# Patient Record
Sex: Male | Born: 1990 | Race: White | Hispanic: No | Marital: Single | Smoking: Current every day smoker
Health system: Southern US, Community
[De-identification: ages and names within clinical notes are randomized; demographics above are authoritative.]

## PROBLEM LIST (undated history)

## (undated) DIAGNOSIS — F99 Mental disorder, not otherwise specified: Secondary | ICD-10-CM

## (undated) DIAGNOSIS — F32A Depression, unspecified: Secondary | ICD-10-CM

## (undated) DIAGNOSIS — F1910 Other psychoactive substance abuse, uncomplicated: Secondary | ICD-10-CM

## (undated) DIAGNOSIS — F329 Major depressive disorder, single episode, unspecified: Secondary | ICD-10-CM

## (undated) HISTORY — DX: Other psychoactive substance abuse, uncomplicated: F19.10

---

## 2013-05-13 ENCOUNTER — Ambulatory Visit (INDEPENDENT_AMBULATORY_CARE_PROVIDER_SITE_OTHER): Payer: BC Managed Care – PPO | Admitting: Emergency Medicine

## 2013-05-13 VITALS — BP 120/66 | HR 97 | Temp 98.6°F | Resp 18 | Ht 69.5 in | Wt 181.0 lb

## 2013-05-13 DIAGNOSIS — J018 Other acute sinusitis: Secondary | ICD-10-CM

## 2013-05-13 MED ORDER — PSEUDOEPHEDRINE-GUAIFENESIN ER 60-600 MG PO TB12: 1.0000 | ORAL_TABLET | Freq: Two times a day (BID) | ORAL | Status: DC

## 2013-05-13 MED ORDER — LEVOFLOXACIN 500 MG PO TABS: 500.0000 mg | ORAL_TABLET | Freq: Every day | ORAL | Status: AC

## 2013-05-13 NOTE — Progress Notes (Signed)
[facility] [address], [place] [address] [phone]  Date:  05/13/2013   Name:  Russell Hall   DOB:  March 12, 1991   MRN:  [id number]  PCP:  No PCP Per Patient    Chief Complaint: Cough and Sore Throat   History of Present Illness:  Russell Hall is a 22 y.o. very pleasant male patient who presents with the following:  Ill since Monday with nasal congestion and drainage green in color.  Has a cough productive of green sputum.  Low grade fever.  No wheezing or shortness of breath.  No rash, nausea or vomiting.  No improvement with over the counter medications or other home remedies. Denies other complaint or health concern today.   There are no active problems to display for this patient.   Past Medical History  Diagnosis Date  . Substance abuse     History reviewed. No pertinent past surgical history.  History  Substance Use Topics  . Smoking status: Never Smoker   . Smokeless tobacco: Not on file  . Alcohol Use: No    History reviewed. No pertinent family history.  Allergies  Allergen Reactions  . Penicillins     Medication list has been reviewed and updated.  No current outpatient prescriptions on file prior to visit.   No current facility-administered medications on file prior to visit.    Review of Systems:  As per HPI, otherwise negative.    Physical Examination: Filed Vitals:   05/13/13 1222  BP: 120/66  Pulse: 97  Temp: 98.6 F (37 C)  Resp: 18   Filed Vitals:   05/13/13 1222  Height: 5' 9.5" (1.765 m)  Weight: 181 lb (82.101 kg)   Body mass index is 26.35 kg/(m^2). Ideal Body Weight: Weight in (lb) to have BMI = 25: 171.4  GEN: WDWN, NAD, Non-toxic, A & O x 3 HEENT: Atraumatic, Normocephalic. Neck supple. No masses, No LAD. Ears and Nose: No external deformity. CV: RRR, No M/G/R. No JVD. No thrill. No extra heart sounds. PULM: CTA B, no wheezes, crackles, rhonchi. No retractions. No resp. distress. No  accessory muscle use. ABD: S, NT, ND, +BS. No rebound. No HSM. EXTR: No c/c/e NEURO Normal gait.  PSYCH: Normally interactive. Conversant. Not depressed or anxious appearing.  Calm demeanor.    Assessment and Plan: Sinusitis augmentin mucinex d  Signed,  Phillips Odor, MD

## 2013-05-13 NOTE — Patient Instructions (Addendum)

## 2013-07-25 ENCOUNTER — Emergency Department (HOSPITAL_COMMUNITY)
Admission: EM | Admit: 2013-07-25 | Discharge: 2013-07-25 | Disposition: A | Discharge status: Home / Self Care | Payer: BC Managed Care – PPO | Attending: Emergency Medicine | Admitting: Emergency Medicine

## 2013-07-25 ENCOUNTER — Emergency Department (HOSPITAL_COMMUNITY): Payer: BC Managed Care – PPO

## 2013-07-25 ENCOUNTER — Encounter (HOSPITAL_COMMUNITY): Payer: Self-pay | Admitting: Emergency Medicine

## 2013-07-25 DIAGNOSIS — Y9239 Other specified sports and athletic area as the place of occurrence of the external cause: Secondary | ICD-10-CM | POA: Insufficient documentation

## 2013-07-25 DIAGNOSIS — S99912A Unspecified injury of left ankle, initial encounter: Secondary | ICD-10-CM

## 2013-07-25 DIAGNOSIS — R110 Nausea: Secondary | ICD-10-CM | POA: Insufficient documentation

## 2013-07-25 DIAGNOSIS — Z880 Allergy status to penicillin: Secondary | ICD-10-CM | POA: Insufficient documentation

## 2013-07-25 DIAGNOSIS — R296 Repeated falls: Secondary | ICD-10-CM | POA: Insufficient documentation

## 2013-07-25 DIAGNOSIS — S8990XA Unspecified injury of unspecified lower leg, initial encounter: Secondary | ICD-10-CM | POA: Insufficient documentation

## 2013-07-25 DIAGNOSIS — Y9361 Activity, american tackle football: Secondary | ICD-10-CM | POA: Insufficient documentation

## 2013-07-25 DIAGNOSIS — R209 Unspecified disturbances of skin sensation: Secondary | ICD-10-CM | POA: Insufficient documentation

## 2013-07-25 MED ORDER — NAPROXEN 500 MG PO TABS: 500.0000 mg | ORAL_TABLET | Freq: Two times a day (BID) | ORAL | Status: DC

## 2013-07-25 MED ORDER — IBUPROFEN 800 MG PO TABS: 800.0000 mg | ORAL_TABLET | Freq: Once | ORAL | Status: DC

## 2013-07-25 NOTE — ED Notes (Signed)
Patient states that he was playing basketball and turned his left ankle. Swelling noted

## 2013-07-25 NOTE — ED Provider Notes (Signed)
CSN: [id number]     Arrival date & time 07/25/13  1530 History  This chart was scribed for non-physician practitioner Arthor Captain, PA-C, working with Gilda Crease, MD, by Yevette Edwards, ED Scribe. This patient was seen in room WWFT/WWFT and the patient's care was started at 4:29 PM.  First MD Initiated Contact with Patient 07/25/13 1541     Chief Complaint  Patient presents with  . Ankle Pain   The history is provided by the patient. No language interpreter was used.   HPI Comments: Russell Hall is a 22 y.o. male who presents to the Emergency Department complaining of pain to his left ankle which occurred when he fell upon his ankle while playing football. The pt heard a "crack" when he fell upon his foot and he experienced immediate pain to his ankle which he rates as 8/10. The pt has experienced swelling to his left ankle as well. He has also experienced numbness and tingling to his left toes. He does not have an orthopedist. The pt is a non-smoker.   Past Medical History  Diagnosis Date  . Substance abuse    History reviewed. No pertinent past surgical history. No family history on file. History  Substance Use Topics  . Smoking status: Never Smoker   . Smokeless tobacco: Not on file  . Alcohol Use: No    Review of Systems  Constitutional: Negative for fever and chills.  Gastrointestinal: Positive for nausea (Secondary to pain).  Musculoskeletal: Positive for myalgias.  Neurological: Positive for weakness and numbness.  All other systems reviewed and are negative.    Allergies  Penicillins  Home Medications  No current outpatient prescriptions on file.  Triage Vitals: BP 157/84  Pulse 100  Temp(Src) 97.4 F (36.3 C) (Oral)  Resp 16  SpO2 96%  Physical Exam  Nursing note and vitals reviewed. Constitutional: He is oriented to person, place, and time. He appears well-developed and well-nourished. No distress.  HENT:  Head: Normocephalic and atraumatic.   Eyes: EOM are normal.  Neck: Neck supple. No tracheal deviation present.  Cardiovascular: Normal rate.   Pulmonary/Chest: Effort normal. No respiratory distress.  Musculoskeletal: Normal range of motion. He exhibits tenderness.  Significant swelling to ankle. Distal pulses are intact.   Neurological: He is alert and oriented to person, place, and time.  Skin: Skin is warm and dry.  Psychiatric: He has a normal mood and affect. His behavior is normal.    ED Course  Procedures (including critical care time)  DIAGNOSTIC STUDIES: Oxygen Saturation is 96% on room air, normal by my interpretation.    COORDINATION OF CARE:  4:31 PM- Discussed treatment plan with patient, and the patient agreed to the plan.   Labs Review Labs Reviewed - No data to display  maging Review Dg Ankle Complete Left  07/25/2013   CLINICAL DATA:  Pain and swelling laterally after fall and twisting.  EXAM: LEFT ANKLE COMPLETE - 3+ VIEW  COMPARISON:  None.  FINDINGS: Negative for fracture. Ankle mortise intact. No osseous degenerative change. Normal alignment. Lateral soft tissue swelling.  IMPRESSION: Lateral soft tissue swelling without fracture suggesting ligamentous injury.   Electronically Signed   By: Oley Balm M.D.   On: 07/25/2013 16:24    EKG Interpretation   None       MDM   1. Ankle injury, left, initial encounter    Patient X-Ray negative for obvious fracture or dislocation. Pain managed in ED. Pt advised to follow up with orthopedics  if symptoms persist for possibility of missed fracture diagnosis. Patient given brace while in ED, conservative therapy recommended and discussed. Patient will be dc home & is agreeable with above plan. I personally performed the services described in this documentation, which was scribed in my presence. The recorded information has been reviewed and is accurate. ]    Arthor Captain, PA-C 07/26/13 [phone]

## 2013-07-27 NOTE — ED Provider Notes (Signed)
Medical screening examination/treatment/procedure(s) were performed by non-physician practitioner and as supervising physician I was immediately available for consultation/collaboration.  EKG Interpretation   None         Gilda Crease, MD 07/27/13 [phone]

## 2013-09-30 ENCOUNTER — Inpatient Hospital Stay (HOSPITAL_COMMUNITY)
Admission: AD | Admit: 2013-09-30 | Discharge: 2013-10-04 | DRG: 897 | Disposition: A | Discharge status: Home / Self Care | Payer: BC Managed Care – PPO | Source: Intra-hospital | Attending: Psychiatry | Admitting: Psychiatry

## 2013-09-30 ENCOUNTER — Encounter (HOSPITAL_COMMUNITY): Payer: Self-pay | Admitting: Emergency Medicine

## 2013-09-30 ENCOUNTER — Emergency Department (HOSPITAL_COMMUNITY)
Admission: EM | Admit: 2013-09-30 | Discharge: 2013-09-30 | Disposition: A | Discharge status: Other Acute Inpatient Hospital | Payer: BC Managed Care – PPO | Attending: Emergency Medicine | Admitting: Emergency Medicine

## 2013-09-30 ENCOUNTER — Encounter (HOSPITAL_COMMUNITY): Payer: Self-pay

## 2013-09-30 DIAGNOSIS — F1920 Other psychoactive substance dependence, uncomplicated: Principal | ICD-10-CM | POA: Diagnosis present

## 2013-09-30 DIAGNOSIS — F1120 Opioid dependence, uncomplicated: Secondary | ICD-10-CM

## 2013-09-30 DIAGNOSIS — F1320 Sedative, hypnotic or anxiolytic dependence, uncomplicated: Secondary | ICD-10-CM

## 2013-09-30 DIAGNOSIS — F1994 Other psychoactive substance use, unspecified with psychoactive substance-induced mood disorder: Secondary | ICD-10-CM | POA: Diagnosis present

## 2013-09-30 DIAGNOSIS — F411 Generalized anxiety disorder: Secondary | ICD-10-CM | POA: Diagnosis present

## 2013-09-30 DIAGNOSIS — F17200 Nicotine dependence, unspecified, uncomplicated: Secondary | ICD-10-CM | POA: Diagnosis present

## 2013-09-30 DIAGNOSIS — R45851 Suicidal ideations: Secondary | ICD-10-CM

## 2013-09-30 DIAGNOSIS — F1110 Opioid abuse, uncomplicated: Secondary | ICD-10-CM | POA: Insufficient documentation

## 2013-09-30 DIAGNOSIS — F1910 Other psychoactive substance abuse, uncomplicated: Secondary | ICD-10-CM

## 2013-09-30 DIAGNOSIS — Z23 Encounter for immunization: Secondary | ICD-10-CM

## 2013-09-30 DIAGNOSIS — G4700 Insomnia, unspecified: Secondary | ICD-10-CM | POA: Diagnosis present

## 2013-09-30 DIAGNOSIS — F1410 Cocaine abuse, uncomplicated: Secondary | ICD-10-CM | POA: Diagnosis present

## 2013-09-30 DIAGNOSIS — F1310 Sedative, hypnotic or anxiolytic abuse, uncomplicated: Secondary | ICD-10-CM | POA: Insufficient documentation

## 2013-09-30 DIAGNOSIS — Z880 Allergy status to penicillin: Secondary | ICD-10-CM | POA: Insufficient documentation

## 2013-09-30 DIAGNOSIS — F329 Major depressive disorder, single episode, unspecified: Secondary | ICD-10-CM

## 2013-09-30 DIAGNOSIS — F410 Panic disorder [episodic paroxysmal anxiety] without agoraphobia: Secondary | ICD-10-CM | POA: Diagnosis present

## 2013-09-30 DIAGNOSIS — Z79899 Other long term (current) drug therapy: Secondary | ICD-10-CM | POA: Insufficient documentation

## 2013-09-30 DIAGNOSIS — Z634 Disappearance and death of family member: Secondary | ICD-10-CM

## 2013-09-30 DIAGNOSIS — F1210 Cannabis abuse, uncomplicated: Secondary | ICD-10-CM | POA: Insufficient documentation

## 2013-09-30 HISTORY — DX: Mental disorder, not otherwise specified: F99

## 2013-09-30 HISTORY — DX: Major depressive disorder, single episode, unspecified: F32.9

## 2013-09-30 HISTORY — DX: Depression, unspecified: F32.A

## 2013-09-30 LAB — COMPREHENSIVE METABOLIC PANEL
ALK PHOS: 133 U/L — AB (ref 39–117)
ALT: 41 U/L (ref 0–53)
AST: 26 U/L (ref 0–37)
Albumin: 4.6 g/dL (ref 3.5–5.2)
BUN: 17 mg/dL (ref 6–23)
CALCIUM: 9.6 mg/dL (ref 8.4–10.5)
CO2: 27 mEq/L (ref 19–32)
Chloride: 100 mEq/L (ref 96–112)
Creatinine, Ser: 0.93 mg/dL (ref 0.50–1.35)
GFR calc non Af Amer: >90 mL/min (ref >90)
GLUCOSE: 118 mg/dL — AB (ref 70–99)
Potassium: 4.3 mEq/L (ref 3.7–5.3)
Sodium: 139 mEq/L (ref 137–147)
Total Bilirubin: 0.5 mg/dL (ref 0.3–1.2)
Total Protein: 8.7 g/dL — ABNORMAL HIGH (ref 6.0–8.3)

## 2013-09-30 LAB — CBC WITH DIFFERENTIAL/PLATELET
Basophils Absolute: 0 10*3/uL (ref 0.0–0.1)
Basophils Relative: 0 % (ref 0–1)
EOS ABS: 0.1 10*3/uL (ref 0.0–0.7)
EOS PCT: 1 % (ref 0–5)
HEMATOCRIT: 43.9 % (ref 39.0–52.0)
HEMOGLOBIN: 15.3 g/dL (ref 13.0–17.0)
LYMPHS ABS: 1.5 10*3/uL (ref 0.7–4.0)
Lymphocytes Relative: 16 % (ref 12–46)
MCH: 30.1 pg (ref 26.0–34.0)
MCHC: 34.9 g/dL (ref 30.0–36.0)
MCV: 86.2 fL (ref 78.0–100.0)
MONOS PCT: 7 % (ref 3–12)
Monocytes Absolute: 0.7 10*3/uL (ref 0.1–1.0)
Neutro Abs: 6.9 10*3/uL (ref 1.7–7.7)
Neutrophils Relative %: 75 % (ref 43–77)
PLATELETS: 280 10*3/uL (ref 150–400)
RBC: 5.09 MIL/uL (ref 4.22–5.81)
RDW: 12.5 % (ref 11.5–15.5)
WBC: 9.2 10*3/uL (ref 4.0–10.5)

## 2013-09-30 LAB — RAPID URINE DRUG SCREEN, HOSP PERFORMED
Amphetamines: NOT DETECTED
BARBITURATES: NOT DETECTED
Benzodiazepines: POSITIVE — AB
COCAINE: NOT DETECTED
Opiates: POSITIVE — AB
Tetrahydrocannabinol: POSITIVE — AB

## 2013-09-30 LAB — ETHANOL

## 2013-09-30 LAB — SALICYLATE LEVEL: Salicylate Lvl: <2 mg/dL — ABNORMAL LOW (ref 2.8–20.0)

## 2013-09-30 LAB — ACETAMINOPHEN LEVEL

## 2013-09-30 MED ORDER — NICOTINE 21 MG/24HR TD PT24
21.0000 mg | MEDICATED_PATCH | Freq: Every day | TRANSDERMAL | Status: DC
  Administered 2013-09-30: 21 mg via TRANSDERMAL
  Filled 2013-09-30: qty 1

## 2013-09-30 MED ORDER — ALUM & MAG HYDROXIDE-SIMETH 200-200-20 MG/5ML PO SUSP: 30.0000 mL | ORAL | Status: DC | PRN

## 2013-09-30 MED ORDER — NICOTINE POLACRILEX 2 MG MT GUM
2.0000 mg | CHEWING_GUM | OROMUCOSAL | Status: DC | PRN
  Administered 2013-09-30: 2 mg via ORAL
  Filled 2013-09-30: qty 1

## 2013-09-30 MED ORDER — ONDANSETRON HCL 4 MG PO TABS
4.0000 mg | ORAL_TABLET | Freq: Three times a day (TID) | ORAL | Status: DC | PRN
  Administered 2013-09-30: 4 mg via ORAL
  Filled 2013-09-30: qty 1

## 2013-09-30 MED ORDER — IBUPROFEN 200 MG PO TABS
600.0000 mg | ORAL_TABLET | Freq: Three times a day (TID) | ORAL | Status: DC | PRN
  Administered 2013-09-30: 600 mg via ORAL
  Filled 2013-09-30: qty 3

## 2013-09-30 MED ORDER — INFLUENZA VAC SPLIT QUAD 0.5 ML IM SUSP
0.5000 mL | INTRAMUSCULAR | Status: AC
  Administered 2013-10-01: 0.5 mL via INTRAMUSCULAR
  Filled 2013-09-30: qty 0.5

## 2013-09-30 MED ORDER — ZOLPIDEM TARTRATE 5 MG PO TABS: 5.0000 mg | ORAL_TABLET | Freq: Every evening | ORAL | Status: DC | PRN

## 2013-09-30 MED ORDER — LORAZEPAM 1 MG PO TABS
1.0000 mg | ORAL_TABLET | Freq: Three times a day (TID) | ORAL | Status: DC | PRN
Start: 2013-09-30 — End: 2013-09-30
  Administered 2013-09-30: 1 mg via ORAL
  Filled 2013-09-30: qty 1

## 2013-09-30 MED ORDER — ACETAMINOPHEN 325 MG PO TABS: 650.0000 mg | ORAL_TABLET | ORAL | Status: DC | PRN

## 2013-09-30 NOTE — ED Notes (Signed)
Patient denies SI, HI. Reports feeling anxious with detox symptoms.  Encouragement offered. Given Zofran, Motrin. Two visitors from [facility] to see patient 2018. Patient safety maintained. Q 15 checks continue.

## 2013-09-30 NOTE — ED Notes (Signed)
Patient states heroin is drug of choice and he took 3 1mg  xanax yesterday.  Reports a history of withdrawal related seizures.  Representattive in from [facility] and states he has placement in their program at d/c following detox. SW informed.

## 2013-09-30 NOTE — BH Assessment (Signed)
Consulted with PA Sharilyn SitesLisa Sanders to obtain clinicals prior to assessing pt.   Russell PeachNajah Dinero Chavira, MS, LCASA Assessment Counselor

## 2013-09-30 NOTE — Progress Notes (Signed)
Pt. Is a 23 year old male that presented [facility] requesting  Detox from heroin.  Report pt. Was positive for THC, benzos, and opiates.  Pt. Reports he purchased opiates and benzos off the streets from age 23 to [age] then started using heroin because it was easier to get and cheaper.  Pt. Attended [facility] in 2014 and was clean for 4 months.  Pt.'s Brother was killed in an MVA in Nov. 2014 and pt. Left [facility] to be with his family.  Pt. started using again.  Pt.'s plans are to return to [facility].  Pt. Was a very calm and cooperative young man.  Denies SI and or HI. Pt. Was oriented to the adult unit and escorted to the 300 hall.

## 2013-09-30 NOTE — ED Notes (Signed)
Pt has personnel from [facility] at bedside with him. States that pt signed into program a while back but had to leave to handle some issues at home. Pt relapsed when at home, then checked back into program yesterday. Program requires pt not to abuse substances.

## 2013-09-30 NOTE — BH Assessment (Signed)
Assessment Note  Russell Hall is an 23 y.o. male. [facility] with C/O medical clearance and is requesting inpatient detox from Heroin and Xanax. Pt reports that he contacted [facility] a faith based inpatient rehab program yesterday. Pt was accepted into the [facility] program yesterday. TTS Clinical research associatewriter obtained collateral information from [employer] [facility] staff that accompanied patient to the ED. Patient reported withdraw symptoms to [facility] staff this morning to include weakness and bodyaches and cramps. [facility] staff do not provide detox services or provide narcotics so they recommended that patient present to ER for detox treatment. Russell Hall reports that patient can return to the program once he is detoxed. Russell Hall reports that his supervisor can be contacted at [phone]. Pt reports some resistance about returning to the [facility] program due to the strict policies that they have in place. Pt is willing to return back to [facility] at this time.  Pt reports daily IV Heroin and Xanax use. Pt reports that he consumes at least 10 bags of Heroin daily which he equates to about 1 gram daily. Pt reports his last use of Heroin was on 09-29-13 at 7:30am in the quantity of 2 bags. Pt reports that he last consumed 2 Xanax bars which he reports is equivalent to 4mg  on 09-29-13. Pt reports a history of Cocaine use and reports that his last use was 2 weeks ago. Pt reports increased depression since his brother's death in an AW on 06-20-13. Pt reports his brother's death triggered his relapse. Pt reports that he is dealing with a lot of stress since his brother's death. Pt  reports a history of AH when withdrawing from Heroin but denies this as a symptom currently. Pt reports withdraw symptoms to include, cramps, body aches, and muscle twitching. Pt denies SI,HI, and no AVH reported.   Consulted with Russell Hall and Russell Hall who agreed to [facility]   for inpatient detox. Pt accepted to the care of Russell Hall assigned to bed 307-1. Russell Hall attending PA has been notified that [facility].  Axis I: Opioid Dependence, Benzodiazepine Abuse Axis II: Deferred Axis III:  Past Medical History  Diagnosis Date  . Substance abuse   . Mental disorder   . Depression    Axis IV: other psychosocial or environmental problems and problems related to social environment Axis V: 31-40 impairment in reality testing  Past Medical History:  Past Medical History  Diagnosis Date  . Substance abuse   . Mental disorder   . Depression     History reviewed. No pertinent past surgical history.  Family History: No family history on file.  Social History:  reports that he has been smoking Cigarettes.  He has been smoking about 0.50 packs per day. He does not have any smokeless tobacco history on file. He reports that he drinks alcohol. He reports that he uses illicit drugs (Opium and Cocaine).  Additional Social History:  Alcohol / Drug Use History of alcohol / drug use?: Yes Negative Consequences of Use: Personal relationships;Financial Withdrawal Symptoms: Cramps Substance #1 Name of Substance 1:  (Heroin(IV)) 1 - Age of First Use:  (17) 1 - Amount (size/oz):  (10 bags daily about 1 gram) 1 - Frequency:  (daily) 1 - Duration:  (on-going use- increased use over past 2.5 months) 1 - Last Use / Amount:  (09-29-13-(2 Xanax bars=4mg )) Substance #2 Name of Substance 2:  (  Xanax) 2 - Age of First Use:  (20) 2 - Amount (size/oz):  (2 bars of Xanax=4mg  per pt'sreport) 2 - Frequency:  (Daily) 2 - Duration:  (on-going use-increased use over 2.5 months) 2 - Last Use / Amount:  (09-29-13-4mg )  CIWA: CIWA-Ar BP: 119/80 mmHg Pulse Rate: 94 COWS: Clinical Opiate Withdrawal Scale (COWS) Resting Pulse Rate: Pulse Rate 81-100 Sweating: Subjective report of chills or flushing Restlessness: Reports difficulty sitting still, but is  able to do so Pupil Size: Pupils pinned or normal size for room light Bone or Joint Aches: Patient reports sever diffuse aching of joints/muscles Runny Nose or Tearing: Not present GI Upset: Vomiting or diarrhea Tremor: Tremor can be felt, but not observed Yawning: No yawning Anxiety or Irritability: Patient obviously irritable/anxious Gooseflesh Skin: Skin is smooth COWS Total Score: 11  Allergies:  Allergies  Allergen Reactions  . Penicillins Hives    childhood    Home Medications:  (Not in a hospital admission)  OB/GYN Status:  No LMP for male patient.  General Assessment Data Location of Assessment: [facility] ED Is this a Tele or Face-to-Face Assessment?: Face-to-Face Is this an Initial Assessment or a Re-assessment for this encounter?: Initial Assessment Living Arrangements: Parent Can pt return to current living arrangement?: Yes Admission Status: Voluntary Is patient capable of signing voluntary admission?: Yes Transfer from: Other (Comment) Systems analyst) Referral Source: Other Leisure centre manager)     [facility] Crisis Care Plan Living Arrangements: Parent Name of Psychiatrist: No Current Providers Name of Therapist: No Current Provider     Risk to self Suicidal Ideation: No Suicidal Intent: No Is patient at risk for suicide?: No Suicidal Plan?: No Access to Means: No What has been your use of drugs/alcohol within the last 12 months?: Primary Heroin and Xanax use, occasional Cocaine use Previous Attempts/Gestures: No How many times?: 0 Other Self Harm Risks: none reported Triggers for Past Attempts: None known Intentional Self Injurious Behavior: None Family Suicide History: No Recent stressful life event(s): Loss (Comment) (Brother died in AW on 2013/07/18 while pt was in tx program) Persecutory voices/beliefs?: No Depression: Yes Depression Symptoms: Insomnia;Fatigue;Feeling worthless/self pity Substance abuse history and/or treatment for substance abuse?:  Yes Suicide prevention information given to non-admitted patients: Not applicable  Risk to Others Homicidal Ideation: No Thoughts of Harm to Others: No Current Homicidal Intent: No Current Homicidal Plan: No Access to Homicidal Means: No Identified Victim: no History of harm to others?: No Assessment of Violence: None Noted Violent Behavior Description: None Reported,Pt cooperative and Calm in ER Does patient have access to weapons?: No Criminal Charges Pending?: No Does patient have a court date: No  Psychosis Hallucinations: None noted Delusions: None noted  Mental Status Report Appear/Hygiene: Other (Comment) (Appropriate) Eye Contact: Good Motor Activity: Restlessness Speech: Logical/coherent Level of Consciousness: Alert Mood: Other (Comment) (Calm,Cooperative, Euthymic) Affect: Appropriate to circumstance Anxiety Level: Minimal Thought Processes: Coherent;Relevant Judgement: Impaired Orientation: Person;Place;Time;Situation Obsessive Compulsive Thoughts/Behaviors: None  Cognitive Functioning Concentration: Normal Memory: Recent Intact;Remote Intact IQ: Average Insight: Fair Impulse Control: Poor Appetite: Poor Weight Loss:  (pt is unsure) Weight Gain: 0 Sleep: Decreased Total Hours of Sleep:  (2 hours per night) Vegetative Symptoms: None  ADLScreening [facility]) Patient's cognitive ability adequate to safely complete daily activities?: Yes Patient able to express need for assistance with ADLs?: Yes Independently performs ADLs?: Yes (appropriate for developmental age)  Prior Inpatient Therapy Prior Inpatient Therapy: Yes Prior Therapy Dates: [facility] Prior Therapy Facilty/Provider(s): August of 2014 Reason for  Treatment: Rehab and Detox  Prior Outpatient Therapy Prior Outpatient Therapy: Yes Prior Therapy Dates: pt reports that he had a therapist in August of 2014 but does not report continued follow-up  since Prior Therapy Facilty/Provider(s): Therapist in hometown of [place] Reason for Treatment: Substance Abuse Therapy  ADL Screening (condition at time of admission) Patient's cognitive ability adequate to safely complete daily activities?: Yes Is the patient deaf or have difficulty hearing?: No Does the patient have difficulty seeing, even when wearing glasses/contacts?: No Does the patient have difficulty concentrating, remembering, or making decisions?: No Patient able to express need for assistance with ADLs?: Yes Does the patient have difficulty dressing or bathing?: No Independently performs ADLs?: Yes (appropriate for developmental age) Does the patient have difficulty walking or climbing stairs?: No Weakness of Legs: None Weakness of Arms/Hands: None  Home Assistive Devices/Equipment Home Assistive Devices/Equipment: None    Abuse/Neglect Assessment (Assessment to be complete while patient is alone) Physical Abuse: Denies Verbal Abuse: Denies Sexual Abuse: Denies Exploitation of patient/patient's resources: Denies Self-Neglect: Denies Values / Beliefs Cultural Requests During Hospitalization: None Spiritual Requests During Hospitalization: None   Advance Directives (For Healthcare) Advance Directive: Patient does not have advance directive;Patient would not like information    Additional Information 1:1 In Past 12 Months?: No CIRT Risk: No Elopement Risk: No Does patient have medical clearance?: Yes     Disposition:  Disposition Initial Assessment Completed for this Encounter: Yes Disposition of Patient: Inpatient treatment program Type of inpatient treatment program: Adult  On Site Evaluation by:   Reviewed with Physician:    Gerline Legacy, MS, LCASA Assessment Counselor  09/30/2013 9:37 PM

## 2013-09-30 NOTE — ED Notes (Signed)
Pt states is here for detox from opioids and heroin. Pt states has been taking about a gram a day.

## 2013-09-30 NOTE — ED Provider Notes (Signed)
Medical screening examination/treatment/procedure(s) were performed by non-physician practitioner and as supervising physician I was immediately available for consultation/collaboration.  EKG Interpretation   None         Dagmar HaitWilliam Libby Goehring, MD 09/30/13 [phone]

## 2013-09-30 NOTE — Tx Team (Signed)
Initial Interdisciplinary Treatment Plan  PATIENT STRENGTHS: (choose at least two) Ability for insight Active sense of humor Average or above average intelligence Motivation for treatment/growth Supportive family/friends  PATIENT STRESSORS: Financial difficulties Substance abuse   PROBLEM LIST: Problem List/Patient Goals Date to be addressed Date deferred Reason deferred Estimated date of resolution   heroine opiate benzo and THC  abuse      [facility] was [facility] months prior to Brothers Death in Nov                                                 DISCHARGE CRITERIA:  Adequate post-discharge living arrangements Motivation to continue treatment in a less acute level of care Verbal commitment to aftercare and medication compliance Withdrawal symptoms are absent or subacute and managed without 24-hour nursing intervention  PRELIMINARY DISCHARGE PLAN: Attend 12-step recovery group Outpatient therapy Return to previous living arrangement  PATIENT/FAMIILY INVOLVEMENT: This treatment plan has been presented to and reviewed with the patient, Russell Hall, and/or family member, .  The patient and family have been given the opportunity to ask questions and make suggestions.  [name], [name] [name] 09/30/2013, 11:45 PM

## 2013-09-30 NOTE — ED Provider Notes (Signed)
CSN: [id number]     Arrival date & time 09/30/13  1524 History   First MD Initiated Contact with Patient 09/30/13 1543     Chief Complaint  Patient presents with  . Detox    (Consider location/radiation/quality/duration/timing/severity/associated sxs/prior Treatment) The history is provided by the patient and medical records.   This is a 23 y.o. M with PMH significant for polysubstance abuse, presenting to the ED for detox. Patient states that the past 2 months he has been consistently using heroin, xanax, and marijuana on a almost daily basis.  Last use was yesterday morning.  Pt states he has previously completed detox program approx 6 years ago and was clean up until recently.  Pt states at time of relapse, he just wanted to "get high" one time and stop but he couldn't.  Drinks occasional beer socially but never in excess.  Denies SI, HI, or AVH.  States since last use he has been feeling nauseated and somewhat jittery.  Denies vomiting, diarrhea, tremors or seizure activity.  Past Medical History  Diagnosis Date  . Substance abuse    No past surgical history on file. No family history on file. History  Substance Use Topics  . Smoking status: Never Smoker   . Smokeless tobacco: Not on file  . Alcohol Use: No    Review of Systems  Psychiatric/Behavioral:       Detox  All other systems reviewed and are negative.    Allergies  Penicillins  Home Medications   Current Outpatient Rx  Name  Route  Sig  Dispense  Refill  . ALPRAZolam (XANAX) 1 MG tablet   Oral   Take 2 mg by mouth once.          BP 143/77  Pulse 88  Temp(Src) 98.3 F (36.8 C) (Oral)  Resp 20  SpO2 99%  Physical Exam  Nursing note and vitals reviewed. Constitutional: He is oriented to person, place, and time. He appears well-developed and well-nourished. No distress.  HENT:  Head: Normocephalic and atraumatic.  Mouth/Throat: Oropharynx is clear and moist.  Eyes: Conjunctivae and EOM are normal.  Pupils are equal, round, and reactive to light.  Neck: Normal range of motion. Neck supple.  Cardiovascular: Normal rate, regular rhythm and normal heart sounds.   Pulmonary/Chest: Effort normal and breath sounds normal. No respiratory distress. He has no wheezes.  Abdominal: Soft. Bowel sounds are normal. There is no tenderness. There is no guarding.  Musculoskeletal: Normal range of motion.  Neurological: He is alert and oriented to person, place, and time.  Skin: Skin is warm and dry. He is not diaphoretic.  Psychiatric: He has a normal mood and affect. He is not actively hallucinating. Thought content is not delusional. He expresses no homicidal and no suicidal ideation. He expresses no suicidal plans and no homicidal plans.  Calm, cooperative; denies SI/HI/AVH    ED Course  Procedures (including critical care time) Labs Review Labs Reviewed  COMPREHENSIVE METABOLIC PANEL - Abnormal; Notable for the following:    Glucose, Bld 118 (*)    Total Protein 8.7 (*)    Alkaline Phosphatase 133 (*)    All other components within normal limits  URINE RAPID DRUG SCREEN (HOSP PERFORMED) - Abnormal; Notable for the following:    Opiates POSITIVE (*)    Benzodiazepines POSITIVE (*)    Tetrahydrocannabinol POSITIVE (*)    All other components within normal limits  SALICYLATE LEVEL - Abnormal; Notable for the following:    Salicylate Lvl <  2.0 (*)    All other components within normal limits  CBC WITH DIFFERENTIAL  ETHANOL  ACETAMINOPHEN LEVEL   Imaging Review No results found.  EKG Interpretation   None       MDM   1. Polysubstance abuse    Labs as above-- UDS + for opiates, benzos, and THC.  Pt calm and cooperative without signs/sx concerning for acute withdrawal.  VS stable.  Pt medically cleared, awaiting TTS evaluation.    Pt has been evaluated by TTS and has been accepted at [facility] under the care of Dr. Dub Mikes.  EMTALA completed, he will be transferred.  Garlon Hatchet,  PA-C 09/30/13 2147

## 2013-10-01 ENCOUNTER — Encounter (HOSPITAL_COMMUNITY): Payer: Self-pay | Admitting: Psychiatry

## 2013-10-01 DIAGNOSIS — F1120 Opioid dependence, uncomplicated: Secondary | ICD-10-CM | POA: Diagnosis present

## 2013-10-01 DIAGNOSIS — F1910 Other psychoactive substance abuse, uncomplicated: Secondary | ICD-10-CM | POA: Diagnosis present

## 2013-10-01 DIAGNOSIS — F1320 Sedative, hypnotic or anxiolytic dependence, uncomplicated: Secondary | ICD-10-CM | POA: Diagnosis present

## 2013-10-01 DIAGNOSIS — Z634 Disappearance and death of family member: Secondary | ICD-10-CM

## 2013-10-01 DIAGNOSIS — F329 Major depressive disorder, single episode, unspecified: Secondary | ICD-10-CM | POA: Diagnosis present

## 2013-10-01 LAB — COMPREHENSIVE METABOLIC PANEL
ALT: 38 U/L (ref 0–53)
AST: 23 U/L (ref 0–37)
Albumin: 4.4 g/dL (ref 3.5–5.2)
Alkaline Phosphatase: 121 U/L — ABNORMAL HIGH (ref 39–117)
BUN: 15 mg/dL (ref 6–23)
CO2: 28 mEq/L (ref 19–32)
Calcium: 9.9 mg/dL (ref 8.4–10.5)
Chloride: 101 mEq/L (ref 96–112)
Creatinine, Ser: 0.97 mg/dL (ref 0.50–1.35)
GFR calc non Af Amer: >90 mL/min (ref >90)
Glucose, Bld: 104 mg/dL — ABNORMAL HIGH (ref 70–99)
Potassium: 4.2 mEq/L (ref 3.7–5.3)
SODIUM: 141 meq/L (ref 137–147)
TOTAL PROTEIN: 8.2 g/dL (ref 6.0–8.3)
Total Bilirubin: 0.7 mg/dL (ref 0.3–1.2)

## 2013-10-01 LAB — RAPID HIV SCREEN (WH-MAU): Rapid HIV Screen: NONREACTIVE

## 2013-10-01 MED ORDER — ALUM & MAG HYDROXIDE-SIMETH 200-200-20 MG/5ML PO SUSP: 30.0000 mL | ORAL | Status: DC | PRN

## 2013-10-01 MED ORDER — QUETIAPINE FUMARATE 50 MG PO TABS
50.0000 mg | ORAL_TABLET | Freq: Every day | ORAL | Status: DC
  Administered 2013-10-01: 50 mg via ORAL
  Filled 2013-10-01 (×3): qty 1

## 2013-10-01 MED ORDER — CLONIDINE HCL 0.1 MG PO TABS: 0.1000 mg | ORAL_TABLET | Freq: Every day | ORAL | Status: DC

## 2013-10-01 MED ORDER — ONDANSETRON 4 MG PO TBDP
4.0000 mg | ORAL_TABLET | Freq: Four times a day (QID) | ORAL | Status: DC | PRN
Start: 2013-10-01 — End: 2013-10-04
  Administered 2013-10-01: 4 mg via ORAL
  Filled 2013-10-01: qty 1

## 2013-10-01 MED ORDER — HYDROXYZINE HCL 25 MG PO TABS
25.0000 mg | ORAL_TABLET | Freq: Four times a day (QID) | ORAL | Status: AC | PRN
  Administered 2013-10-03: 25 mg via ORAL
  Filled 2013-10-01: qty 1

## 2013-10-01 MED ORDER — CHLORDIAZEPOXIDE HCL 25 MG PO CAPS
25.0000 mg | ORAL_CAPSULE | Freq: Three times a day (TID) | ORAL | Status: AC
  Administered 2013-10-02 (×3): 25 mg via ORAL
  Filled 2013-10-01 (×3): qty 1

## 2013-10-01 MED ORDER — CHLORDIAZEPOXIDE HCL 25 MG PO CAPS
25.0000 mg | ORAL_CAPSULE | Freq: Four times a day (QID) | ORAL | Status: AC
  Administered 2013-10-01 (×3): 25 mg via ORAL
  Filled 2013-10-01 (×3): qty 1

## 2013-10-01 MED ORDER — LOPERAMIDE HCL 2 MG PO CAPS
2.0000 mg | ORAL_CAPSULE | ORAL | Status: DC | PRN
  Administered 2013-10-01: 4 mg via ORAL
  Filled 2013-10-01: qty 2

## 2013-10-01 MED ORDER — CHLORDIAZEPOXIDE HCL 25 MG PO CAPS
25.0000 mg | ORAL_CAPSULE | ORAL | Status: AC
  Administered 2013-10-03 (×2): 25 mg via ORAL
  Filled 2013-10-01 (×2): qty 1

## 2013-10-01 MED ORDER — CHLORDIAZEPOXIDE HCL 25 MG PO CAPS: 25.0000 mg | ORAL_CAPSULE | ORAL | Status: DC

## 2013-10-01 MED ORDER — CLONIDINE HCL 0.1 MG PO TABS
0.1000 mg | ORAL_TABLET | Freq: Four times a day (QID) | ORAL | Status: AC
  Administered 2013-10-01 – 2013-10-03 (×10): 0.1 mg via ORAL
  Filled 2013-10-01 (×11): qty 1

## 2013-10-01 MED ORDER — DICYCLOMINE HCL 20 MG PO TABS
20.0000 mg | ORAL_TABLET | Freq: Four times a day (QID) | ORAL | Status: DC | PRN
  Administered 2013-10-01: 20 mg via ORAL
  Filled 2013-10-01: qty 1

## 2013-10-01 MED ORDER — CHLORDIAZEPOXIDE HCL 25 MG PO CAPS: 25.0000 mg | ORAL_CAPSULE | Freq: Four times a day (QID) | ORAL | Status: DC

## 2013-10-01 MED ORDER — HYDROXYZINE HCL 25 MG PO TABS
25.0000 mg | ORAL_TABLET | Freq: Four times a day (QID) | ORAL | Status: DC | PRN
  Administered 2013-10-01 – 2013-10-03 (×4): 25 mg via ORAL
  Filled 2013-10-01 (×4): qty 1
  Filled 2013-10-01: qty 10
  Filled 2013-10-01: qty 1

## 2013-10-01 MED ORDER — LOPERAMIDE HCL 2 MG PO CAPS: 2.0000 mg | ORAL_CAPSULE | ORAL | Status: AC | PRN

## 2013-10-01 MED ORDER — NICOTINE POLACRILEX 2 MG MT GUM
2.0000 mg | CHEWING_GUM | OROMUCOSAL | Status: DC | PRN
  Administered 2013-10-03: 2 mg via ORAL
  Filled 2013-10-01: qty 1

## 2013-10-01 MED ORDER — CHLORDIAZEPOXIDE HCL 25 MG PO CAPS: 25.0000 mg | ORAL_CAPSULE | Freq: Every day | ORAL | Status: DC

## 2013-10-01 MED ORDER — CHLORDIAZEPOXIDE HCL 25 MG PO CAPS
25.0000 mg | ORAL_CAPSULE | Freq: Every day | ORAL | Status: AC
  Administered 2013-10-04: 25 mg via ORAL
  Filled 2013-10-01: qty 1

## 2013-10-01 MED ORDER — ADULT MULTIVITAMIN W/MINERALS CH
1.0000 | ORAL_TABLET | Freq: Every day | ORAL | Status: DC
  Administered 2013-10-01 – 2013-10-04 (×4): 1 via ORAL
  Filled 2013-10-01 (×6): qty 1

## 2013-10-01 MED ORDER — CHLORDIAZEPOXIDE HCL 25 MG PO CAPS: 25.0000 mg | ORAL_CAPSULE | Freq: Four times a day (QID) | ORAL | Status: DC | PRN

## 2013-10-01 MED ORDER — CLONIDINE HCL 0.1 MG PO TABS
0.1000 mg | ORAL_TABLET | ORAL | Status: DC
  Administered 2013-10-03 – 2013-10-04 (×2): 0.1 mg via ORAL
  Filled 2013-10-01 (×4): qty 1

## 2013-10-01 MED ORDER — CHLORDIAZEPOXIDE HCL 25 MG PO CAPS: 25.0000 mg | ORAL_CAPSULE | Freq: Three times a day (TID) | ORAL | Status: DC

## 2013-10-01 MED ORDER — METHOCARBAMOL 500 MG PO TABS
500.0000 mg | ORAL_TABLET | Freq: Three times a day (TID) | ORAL | Status: DC | PRN
  Administered 2013-10-01 – 2013-10-04 (×6): 500 mg via ORAL
  Filled 2013-10-01 (×6): qty 1

## 2013-10-01 MED ORDER — NAPROXEN 500 MG PO TABS
500.0000 mg | ORAL_TABLET | Freq: Two times a day (BID) | ORAL | Status: DC | PRN
  Administered 2013-10-01: 500 mg via ORAL
  Filled 2013-10-01: qty 1

## 2013-10-01 MED ORDER — ONDANSETRON 4 MG PO TBDP: 4.0000 mg | ORAL_TABLET | Freq: Four times a day (QID) | ORAL | Status: AC | PRN

## 2013-10-01 MED ORDER — ACETAMINOPHEN 325 MG PO TABS: 650.0000 mg | ORAL_TABLET | Freq: Four times a day (QID) | ORAL | Status: DC | PRN

## 2013-10-01 MED ORDER — MAGNESIUM HYDROXIDE 400 MG/5ML PO SUSP: 30.0000 mL | Freq: Every day | ORAL | Status: DC | PRN

## 2013-10-01 NOTE — [facility] Suicide Risk Assessment (Signed)
[facility] INPATIENT: Family/Significant Other Suicide Prevention Education   Suicide Prevention Education:  Education Completed; No one has been identified by the patient as the family member/significant other with whom the patient will be residing, and identified as the person(s) who will aid the patient in the event of a mental health crisis (suicidal ideations/suicide attempt).   Pt did not c/o SI at admission, nor have they endorsed SI during their stay here. SPE not required. SPI pamphlet provided to pt and he was encouraged to share information with his support network, ask questions, and talk about any concerns.   The suicide prevention education provided includes the following:  Suicide risk factors  Suicide prevention and interventions  National Suicide Hotline telephone number  [facility] assessment telephone number  [facility] Emergency Assistance 911  [facility] and/or [facility] Crisis Unit telephone number  Reyes IvanChelsea Horton, [place] 10/01/2013  10:15 AM

## 2013-10-01 NOTE — [facility] Group Notes (Signed)
[facility] LCSW Group Therapy  10/01/2013 3:25 PM  Type of Therapy:  Group Therapy  Participation Level:  Did Not Attend-pt went to room when group began/refused to attend   Smart, Melita Villalona LCSWA  10/01/2013, 3:25 PM

## 2013-10-01 NOTE — [facility] Counselor (Signed)
Adult Comprehensive Assessment  Patient ID: Russell Hall, male   DOB: [birth year], 23 y.o.   MRN: [id number]  Information Source: Information source: Patient  Current Stressors:  Educational / Learning stressors: 10th grade education Employment / Job issues: Unemployed Family Relationships: N/A Surveyor, quantityinancial / Lack of resources (include bankruptcy): no income Housing / Lack of housing: N/A Physical health (include injuries & life threatening diseases): N/A Social relationships: N/A Substance abuse: Heroin and xanax abuse Bereavement / Loss: Brother was killed in a car accident - triggered him to use again  Living/Environment/Situation:  Living Arrangements: Parent Living conditions (as described by patient or guardian): [place], [place]  Pt reports this is a good environment.  How long has patient lived in current situation?: All his life What is atmosphere in current home: Supportive;Loving;Comfortable  Family History:  Marital status: Single Does patient have children?: No  Childhood History:  By whom was/is the patient raised?: Both parents Additional childhood history information: Pt reports having a good childhood.  Description of patient's relationship with caregiver when they were a child: Pt reports getting along well with parents growing up.  Patient's description of current relationship with people who raised him/her: Pt states that parents continue to be supportive today.   Does patient have siblings?: Yes Number of Siblings: 3 Description of patient's current relationship with siblings: Brother is deceased, reports being close to living brother and sister.   Did patient suffer any verbal/emotional/physical/sexual abuse as a child?: No Did patient suffer from severe childhood neglect?: No Has patient ever been sexually abused/assaulted/raped as an adolescent or adult?: No Was the patient ever a victim of a crime or a disaster?:  No Witnessed domestic violence?: No Has patient been effected by domestic violence as an adult?: No  Education:  Highest grade of school patient has completed: 10th grade Currently a student?: No Learning disability?: No  Employment/Work Situation:   Employment situation: Unemployed Patient's job has been impacted by current illness: No What is the longest time patient has a held a job?: 5 years 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 Has patient ever been in the [employer] and Companymilitary?: No Has patient ever served in Buyer, retailcombat?: No  Financial Resources:   Surveyor, quantityinancial resources: Support from parents / caregiver;Private insurance Does patient have a Lawyerrepresentative payee or guardian?: No  Alcohol/Substance Abuse:   What has been your use of drugs/alcohol within the last 12 months?: Heroin - 1 gram daily for the last 3 months, Xanax - 2-4 mg daily for the last 3 months If attempted suicide, did drugs/alcohol play a role in this?: No Alcohol/Substance Abuse Treatment Hx: Past Tx, Inpatient If yes, describe [facility] 3 months ago Has alcohol/substance abuse ever caused legal problems?: No  Social Support System:   Forensic psychologistatient's Community Support System: Good Describe Community Support System: Pt reports all of his family is supportive Type of faith/religion: Ephriam KnucklesChristian How does patient's faith help to cope with current illness?: prayer, church attendance  Leisure/Recreation:   Leisure and Hobbies: hunting and fishing  Strengths/Needs:   What things does the patient do well?: pt can't name anything at this time In what areas does patient struggle / problems for patient: substance abuse  Discharge Plan:   Does patient have access to transportation?: Yes Will patient be returning to same living situation after discharge?: Yes Currently receiving community mental health services: No If no, would patient like referral  for services  when discharged?: Yes (What county?) [facility]) Does patient have financial barriers related to discharge medications?: No  Summary/Recommendations:     Patient is a 23 year old Caucasian Male with a diagnosis of Opioid Dependence, Benzodiazepine Abuse.  Patient lives in [place], [place] with his parents.  Pt states that he has a history of substance use with a recent relapse from the death of his brother in 2023/06/11.  Pt was accepted to [facility] but came here to detox first.  Patient will benefit from crisis stabilization, medication evaluation, group therapy and psycho education in addition to case management for discharge planning.    Horton, Salome Arnt. 10/01/2013

## 2013-10-01 NOTE — Clinical Social Work Note (Signed)
CSW attempted to reach Molly MaduroRobert Erps to inquire about pt returning to [facility]. The phone number listed in the assessment was the wrong number.  Pt does not know the phone number.  CSW left a voicemail message for the intake person at [facility] today.   Reyes IvanChelsea Horton, LCSW 10/01/2013  2:20 PM

## 2013-10-01 NOTE — [facility] Suicide Risk Assessment (Signed)
   Nursing information obtained from:  Patient Demographic factors:  Male;Adolescent or young adult;Caucasian;Unemployed Current Mental Status:  NA Loss Factors:  Loss of significant relationship;Financial problems / change in socioeconomic status (Brother passed in Nov 2014) Historical Factors:  Family history of mental illness or substance abuse Risk Reduction Factors:  Sense of responsibility to family;Religious beliefs about death;Living with another person, especially a relative;Positive social support Total Time spent with patient: 45 minutes  CLINICAL FACTORS:   Depression:   Comorbid alcohol abuse/dependence Alcohol/Substance Abuse/Dependencies  Psychiatric Specialty Exam: Physical Exam  ROS  Blood pressure 161/75, pulse 103, temperature 97.6 F (36.4 C), temperature source Oral, resp. rate 16, height 5' 8.5" (1.74 m), weight 80.74 kg (178 lb).Body mass index is 26.67 kg/(m^2).  General Appearance: Fairly Groomed  Patent attorneyye Contact::  Fair  Speech:  Clear and Coherent  Volume:  Decreased  Mood:  Depressed  Affect:  Depressed and Tearful  Thought Process:  Coherent and Goal Directed  Orientation:  Full (Time, Place, and Person)  Thought Content:  symptoms, worries, concerns, death of his brother  Suicidal Thoughts:  No  Homicidal Thoughts:  No  Memory:  Immediate;   Fair Recent;   Fair Remote;   Fair  Judgement:  Fair  Insight:  Present  Psychomotor Activity:  Restlessness  Concentration:  Fair  Recall:  FiservFair  Fund of Knowledge:Fair  Language: Fair  Akathisia:  No  Handed:    AIMS (if indicated):     Assets:  Desire for Improvement  Sleep:  Number of Hours: 5.25   Musculoskeletal: Strength & Muscle Tone: within normal limits Gait & Station: normal Patient leans: N/A  COGNITIVE FEATURES THAT CONTRIBUTE TO RISK:  Closed-mindedness Polarized thinking Thought constriction (tunnel vision)    SUICIDE RISK:   Moderate:  Frequent suicidal ideation with limited  intensity, and duration, some specificity in terms of plans, no associated intent, good self-control, limited dysphoria/symptomatology, some risk factors present, and identifiable protective factors, including available and accessible social support.  PLAN OF CARE: Supportive approach/coping skills/relapse prevention                               Detox/ reassess and address the co morbidities                               CBT;mindfulness/grief loss  I certify that inpatient services furnished can reasonably be expected to improve the patient's condition.  Gardy Montanari A 10/01/2013, 3:09 PM

## 2013-10-01 NOTE — H&P (Signed)
Psychiatric Admission Assessment Adult  Patient Identification:  Russell Hall Date of Evaluation:  10/01/2013 Chief Complaint:  BENZO DEPENDENCE OPIOID DEPENDENCY History of Present Illness:: 23 Y/o male who states he has been abusing Heroin and Xanax. Was in [facility] from August to December 2014. His brother was killed in a car accident. He left the program, relapsed after he went to the funeral. His family lives in [place]. He has been using heroin IV every day, using Xanax up to 4 mg daily. He had been using heroin on and off for 5 years. Before using heroin he was using pain pills  Using Xanax for three years. Has used cocaine snorting.  smokes marijuna occasionally and does not ingest alcohol. Admits that he has not grieved the death of his brother. Admits to an underlying depression.  Elements:  Location:  opioid, benzodiazepine dependence, underlying depressive disorder and bereavement. Quality:  since his lst realspe unbale to stop increasingly more depressed. Severity:  severe as unable to function. Timing:  every day. Duration:  relapsed after his brother was killed in a car accident. Context:  addiction to opioid, benzodiazepines, last relapse triggered by his brother's death. Associated Signs/Synptoms: Depression Symptoms:  depressed mood, anhedonia, insomnia, fatigue, difficulty concentrating, suicidal thoughts without plan, anxiety, panic attacks, loss of energy/fatigue, weight loss, decreased appetite, (Hypo) Manic Symptoms:  Irritable Mood, Labiality of Mood, Anxiety Symptoms:  Excessive Worry, Panic Symptoms, Psychotic Symptoms:  Paranoia, PTSD Symptoms: Negative Total Time spent with patient: 45 minutes  Psychiatric Specialty Exam: Physical Exam  Review of Systems  Constitutional: Positive for weight loss and malaise/fatigue.  Eyes: Negative.   Respiratory: Positive for cough.        Half a pack a day  Cardiovascular: Negative.    Gastrointestinal: Positive for heartburn, nausea and diarrhea.  Genitourinary: Negative.   Musculoskeletal: Positive for back pain, joint pain, myalgias and neck pain.  Skin: Negative.   Neurological: Positive for tremors, weakness and headaches.  Endo/Heme/Allergies: Negative.   Psychiatric/Behavioral: Positive for depression and suicidal ideas. The patient is nervous/anxious and has insomnia.     Blood pressure 129/94, pulse 96, temperature 97.4 F (36.3 C), temperature source Oral, resp. rate 20, height 5' 8.5" (1.74 m), weight 80.74 kg (178 lb).Body mass index is 26.67 kg/(m^2).  General Appearance: Fairly Groomed  Engineer, water::  Fair  Speech:  Clear and Coherent and not spontaneous  Volume:  Decreased  Mood:  Depressed  Affect:  Depressed and Tearful  Thought Process:  Coherent and Goal Directed  Orientation:  Full (Time, Place, and Person)  Thought Content:  symptoms, worries, concerns  Suicidal Thoughts:  No  Homicidal Thoughts:  No  Memory:  Immediate;   Fair Recent;   Fair Remote;   Fair  Judgement:  Fair  Insight:  Present  Psychomotor Activity:  Restlessness  Concentration:  Fair  Recall:  [employer] [place]: Fair  Akathisia:  No  Handed:    AIMS (if indicated):     Assets:  Desire for Improvement  Sleep:  Number of Hours: 5.25    Musculoskeletal: Strength & Muscle Tone: within normal limits Gait & Station: normal Patient leans: N/A  Past Psychiatric History: Diagnosis:  Hospitalizations: [facility]  Outpatient Care:  Substance Abuse Care: [facility]  Self-Mutilation: Denies  Suicidal Attempts:Denies  Violent Behaviors:Deniee   Past Medical History:   Past Medical History  Diagnosis Date  . Substance abuse   . Mental disorder   .  Depression    Seizure History:  coming off the Xanax Allergies:   Allergies  Allergen Reactions  . Penicillins Hives    childhood   PTA Medications: Prescriptions prior to  admission  Medication Sig Dispense Refill  . ALPRAZolam (XANAX) 1 MG tablet Take 2 mg by mouth once.        Previous Psychotropic Medications:  Medication/Dose                 Substance Abuse History in the last 12 months:  yes  Consequences of Substance Abuse: Legal Consequences:  DWI Withdrawal Symptoms:   Cramps Diaphoresis Diarrhea Headaches Nausea Tremors Vomiting  Social History:  reports that he has been smoking Cigarettes.  He has a 5 pack-year smoking history. He does not have any smokeless tobacco history on file. He reports that he drinks alcohol. He reports that he uses illicit drugs (Opium and Heroin). Additional Social History: Pain Medications: see PTA non prescribed Prescriptions: NA Over the Counter: see PTA History of alcohol / drug use?: Yes Longest period of sobriety (when/how long): only social drinker/ longest period without drugs in last 3 years is 4 months Negative Consequences of Use: Financial;Personal relationships;Work / School Withdrawal Symptoms: Agitation;Fever / Chills;Irritability;Nausea / Vomiting;Tremors Name of Substance 1: Heroin 1 - Age of First Use: 80yr 1 - Amount (size/oz): 1 gram 1 - Frequency: daily 1 - Duration: 3 yrears excluding 4 months sobriety 1 - Last Use / Amount: 09/29/2012 Name of Substance 2: opiates 2 - Age of First Use: 23yrold 2 - Amount (size/oz): 30 mg oxycodone daily some days double 2 - Frequency: daily 2 - Duration: 2 years quit at [age] old 2 - Last Use / Amount: 09/29/2013                Current Place of Residence:  Lives with mother and father  Place of Birth:   Family Members: Marital Status:  Single Children:  Sons:  Daughters: Relationships: Education:  10 th grade quit  Educational Problems/Performance: Religious Beliefs/Practices: Pentecostal History of Abuse (Emotional/Phsycial/Sexual) Denies OcPensions consultantPlumbing, elDealerfaBarrister's clerkistory:   None. Legal History: DWI Hobbies/Interests:  Family History:  History reviewed. No pertinent family history.  Results for orders placed during the hospital encounter of 09/30/13 (from the past 72 hour(s))  COMPREHENSIVE METABOLIC PANEL     Status: Abnormal   Collection Time    10/01/13  6:15 AM      Result Value Range   Sodium 141  137 - 147 mEq/L   Potassium 4.2  3.7 - 5.3 mEq/L   Chloride 101  96 - 112 mEq/L   CO2 28  19 - 32 mEq/L   Glucose, Bld 104 (*) 70 - 99 mg/dL   BUN 15  6 - 23 mg/dL   Creatinine, Ser 0.97  0.50 - 1.35 mg/dL   Calcium 9.9  8.4 - 10.5 mg/dL   Total Protein 8.2  6.0 - 8.3 g/dL   Albumin 4.4  3.5 - 5.2 g/dL   AST 23  0 - 37 U/L   ALT 38  0 - 53 U/L   Alkaline Phosphatase 121 (*) 39 - 117 U/L   Total Bilirubin 0.7  0.3 - 1.2 mg/dL   GFR calc non Af Amer >90  >90 mL/min   GFR calc Af Amer >90  >90 mL/min   Comment: (NOTE)     The eGFR has been calculated using the CKD EPI equation.     This  calculation has not been validated in all clinical situations.     eGFR's persistently <90 mL/min signify possible Chronic Kidney     Disease.     Performed at [facility]   Psychological Evaluations:  Assessment:   DSM5:  Schizophrenia Disorders:  none Obsessive-Compulsive Disorders:  none Trauma-Stressor Disorders:  none Substance/Addictive Disorders:  Opioid Disorder - Severe (304.00), Benzodiazepine related disorder severe Depressive Disorders:  Major Depressive Disorder - Severe (296.23)  AXIS I:  Bereavement and Substance Induced Mood Disorder AXIS II:  Deferred AXIS III:   Past Medical History  Diagnosis Date  . Substance abuse   . Mental disorder   . Depression    AXIS IV:  death of brother, active addiction AXIS V:  41-50 serious symptoms  Treatment Plan/Recommendations:  Supportive approach/coping skills/relapse prevention                                                                 Clonidine/Librium detox                                                                   Reassess and address the co morbidities  Treatment Plan Summary: Daily contact with patient to assess and evaluate symptoms and progress in treatment Medication management Current Medications:  Current Facility-Administered Medications  Medication Dose Route Frequency Provider Last Rate Last Dose  . acetaminophen (TYLENOL) tablet 650 mg  650 mg Oral Q6H PRN Evanna Glenda Chroman, NP      . alum & mag hydroxide-simeth (MAALOX/MYLANTA) 200-200-20 MG/5ML suspension 30 mL  30 mL Oral Q4H PRN Evanna Glenda Chroman, NP      . cloNIDine (CATAPRES) tablet 0.1 mg  0.1 mg Oral QID Malena Peer, NP   0.1 mg at 10/01/13 1761   Followed by  . [START ON 10/03/2013] cloNIDine (CATAPRES) tablet 0.1 mg  0.1 mg Oral BH-qamhs Evanna Glenda Chroman, NP       Followed by  . [START ON 10/06/2013] cloNIDine (CATAPRES) tablet 0.1 mg  0.1 mg Oral QAC breakfast Evanna Glenda Chroman, NP      . dicyclomine (BENTYL) tablet 20 mg  20 mg Oral Q6H PRN Evanna Glenda Chroman, NP      . hydrOXYzine (ATARAX/VISTARIL) tablet 25 mg  25 mg Oral Q6H PRN Malena Peer, NP   25 mg at 10/01/13 0644  . influenza vac split quadrivalent PF (FLUARIX) injection 0.5 mL  0.5 mL Intramuscular Tomorrow-1000 Nicholaus Bloom, MD      . loperamide (IMODIUM) capsule 2-4 mg  2-4 mg Oral PRN Malena Peer, NP   4 mg at 10/01/13 0644  . magnesium hydroxide (MILK OF MAGNESIA) suspension 30 mL  30 mL Oral Daily PRN Evanna Glenda Chroman, NP      . methocarbamol (ROBAXIN) tablet 500 mg  500 mg Oral Q8H PRN Evanna Glenda Chroman, NP      . naproxen (NAPROSYN) tablet 500 mg  500 mg Oral BID PRN Evanna Glenda Chroman, NP      . ondansetron (  ZOFRAN-ODT) disintegrating tablet 4 mg  4 mg Oral Q6H PRN Malena Peer, NP   4 mg at 10/01/13 7741    Observation Level/Precautions:  15 minute checks  Laboratory:  As per the ED  Psychotherapy: Individual/group   Medications:  Librium/clonidine detox   Consultations:    Discharge Concerns:    Estimated LOS: 3-5 days  Other:     I certify that inpatient services furnished can reasonably be expected to improve the patient's condition.   [place] A 2/5/201510:07 AM

## 2013-10-01 NOTE — Progress Notes (Signed)
D. Pt has been up and has been visible in milieu today, attending and participating in various activities. Pt has endorsed depression and anxiety and also various subjective symptoms of withdrawal. Pt has endorsed cravings and feelings of agitation. Pt spoke about getting detoxed so he can get into long-term treatment. Pt has received medications without incident. A. Support and encouragement provided, medication education provided. R. Pt verbalized understanding, safety maintained.

## 2013-10-01 NOTE — Progress Notes (Signed)
[facility] Group Notes:  (Nursing/MHT/Case Management/Adjunct)  Date:  10/01/2013  Time:  2100 Type of Therapy:  wrap up group  Participation Level:  Active  Participation Quality:  Appropriate, Attentive, Sharing and Supportive  Affect:  Appropriate  Cognitive:  Appropriate  Insight:  Good  Engagement in Group:  Engaged  Modes of Intervention:  Clarification, Education and Support  Summary of Progress/Problems: Pt reports feeling better physically today in regards to his withdrawal symptoms.  Pt is looking forward to getting home and back to life. Pt shared that after his brother passed in a car accident he became very depressed and lost himself. 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  Pt is considering inpatient treatment but wants to see family first. Grief counseling for pt and mother would benefit and aid in his sobriety.   Russell Hall, Russell Hall Carol 10/01/2013, 10:36 PM

## 2013-10-02 LAB — HEPATITIS PANEL, ACUTE
HCV AB: REACTIVE — AB
Hep A IgM: NONREACTIVE
Hep B C IgM: NONREACTIVE
Hepatitis B Surface Ag: NEGATIVE

## 2013-10-02 MED ORDER — ENSURE COMPLETE PO LIQD
237.0000 mL | Freq: Two times a day (BID) | ORAL | Status: DC
  Administered 2013-10-02 – 2013-10-03 (×3): 237 mL via ORAL

## 2013-10-02 MED ORDER — GABAPENTIN 100 MG PO CAPS
100.0000 mg | ORAL_CAPSULE | Freq: Three times a day (TID) | ORAL | Status: DC
  Administered 2013-10-02 – 2013-10-04 (×5): 100 mg via ORAL
  Filled 2013-10-02 (×8): qty 1

## 2013-10-02 MED ORDER — QUETIAPINE FUMARATE 100 MG PO TABS
100.0000 mg | ORAL_TABLET | Freq: Every day | ORAL | Status: DC
  Administered 2013-10-02 – 2013-10-03 (×2): 100 mg via ORAL
  Filled 2013-10-02 (×3): qty 1

## 2013-10-02 NOTE — Progress Notes (Signed)
[facility] MD Progress Note  10/02/2013 4:59 PM Russell Hall  MRN:  [id number] Subjective:  Russell Hall endorses feeling better detox wise but still endorses feeling down depressed, actively  grieving the death of his brother. Sates that he is not sure if he is going to [employer] as he was planning to do. He has been talking to his family. He states that  he would rather have outpatient treatment. He was on Suboxone at one time and did well. He would not be able  to be on psychotropics like Seroquel if he was in [employer]. He does not want to be away from his family for the period of time that [facility] will require. Wants to be there for his little nephew who lost his father (states that his dead brother and him look alike) States that this last relapse taught him a lot.  Diagnosis:   DSM5: Schizophrenia Disorders:  none Obsessive-Compulsive Disorders:  none Trauma-Stressor Disorders:  none Substance/Addictive Disorders:  Opioid Disorder - Severe (304.00), Benzodiazepine Disorder Depressive Disorders:  Major Depressive Disorder - Moderate (296.22) Total Time spent with patient: 30 minutes  Axis I: Bereavement  ADL's:  Intact  Sleep: Poor  Appetite:  Fair  Suicidal Ideation:  Plan:  denies Intent:  denies Means:  denies Homicidal Ideation:  Plan:  denies Intent:  denies Means:  denies AEB (as evidenced by):  Psychiatric Specialty Exam: Physical Exam  Review of Systems  Constitutional: Negative.   HENT: Negative.   Eyes: Negative.   Respiratory: Negative.   Cardiovascular: Negative.   Gastrointestinal: Negative.   Genitourinary: Negative.   Musculoskeletal: Negative.   Skin: Negative.   Neurological: Negative.   Endo/Heme/Allergies: Negative.   Psychiatric/Behavioral: Positive for depression and substance abuse. The patient is nervous/anxious.     Blood pressure 97/61, pulse 101, temperature 97.2 F (36.2 C), temperature source Oral, resp. rate 20, height 5' 8.5" (1.74  m), weight 80.74 kg (178 lb).Body mass index is 26.67 kg/(m^2).  General Appearance: Fairly Groomed  Engineer, water::  Fair  Speech:  Clear and Coherent and Slow  Volume:  Decreased  Mood:  Depressed  Affect:  sad, teary eyed  Thought Process:  Coherent and Goal Directed  Orientation:  Full (Time, Place, and Person)  Thought Content:  symptoms, worries, concerns  Suicidal Thoughts:  No  Homicidal Thoughts:  No  Memory:  Immediate;   Fair Recent;   Fair Remote;   Fair  Judgement:  Fair  Insight:  Present  Psychomotor Activity:  Restlessness  Concentration:  Fair  Recall:  [employer]: Fair  Akathisia:  No  Handed:    AIMS (if indicated):     Assets:  Desire for Improvement Housing Social Support Vocational/Educational  Sleep:  Number of Hours: 6   Musculoskeletal: Strength & Muscle Tone: within normal limits Gait & Station: normal Patient leans: N/A  Current Medications: Current Facility-Administered Medications  Medication Dose Route Frequency Provider Last Rate Last Dose  . acetaminophen (TYLENOL) tablet 650 mg  650 mg Oral Q6H PRN Evanna Glenda Chroman, NP      . alum & mag hydroxide-simeth (MAALOX/MYLANTA) 200-200-20 MG/5ML suspension 30 mL  30 mL Oral Q4H PRN Evanna Glenda Chroman, NP      . chlordiazePOXIDE (LIBRIUM) capsule 25 mg  25 mg Oral Q6H PRN Nicholaus Bloom, MD      . chlordiazePOXIDE (LIBRIUM) capsule 25 mg  25 mg Oral TID Nicholaus Bloom, MD   25 mg  at 10/02/13 1201   Followed by  . [START ON 10/03/2013] chlordiazePOXIDE (LIBRIUM) capsule 25 mg  25 mg Oral BH-qamhs Nicholaus Bloom, MD       Followed by  . [START ON 10/04/2013] chlordiazePOXIDE (LIBRIUM) capsule 25 mg  25 mg Oral Daily Nicholaus Bloom, MD      . cloNIDine (CATAPRES) tablet 0.1 mg  0.1 mg Oral QID Malena Peer, NP   0.1 mg at 10/02/13 1201   Followed by  . [START ON 10/03/2013] cloNIDine (CATAPRES) tablet 0.1 mg  0.1 mg Oral BH-qamhs Evanna Glenda Chroman, NP       Followed  by  . [START ON 10/06/2013] cloNIDine (CATAPRES) tablet 0.1 mg  0.1 mg Oral QAC breakfast Evanna Glenda Chroman, NP      . dicyclomine (BENTYL) tablet 20 mg  20 mg Oral Q6H PRN Malena Peer, NP   20 mg at 10/01/13 2002  . feeding supplement (ENSURE COMPLETE) (ENSURE COMPLETE) liquid 237 mL  237 mL Oral BID BM Christie Beckers, RD      . gabapentin (NEURONTIN) capsule 100 mg  100 mg Oral TID Nicholaus Bloom, MD      . hydrOXYzine (ATARAX/VISTARIL) tablet 25 mg  25 mg Oral Q6H PRN Malena Peer, NP   25 mg at 10/02/13 1327  . hydrOXYzine (ATARAX/VISTARIL) tablet 25 mg  25 mg Oral Q6H PRN Nicholaus Bloom, MD      . loperamide (IMODIUM) capsule 2-4 mg  2-4 mg Oral PRN Malena Peer, NP   4 mg at 10/01/13 0644  . loperamide (IMODIUM) capsule 2-4 mg  2-4 mg Oral PRN Nicholaus Bloom, MD      . magnesium hydroxide (MILK OF MAGNESIA) suspension 30 mL  30 mL Oral Daily PRN Evanna Glenda Chroman, NP      . methocarbamol (ROBAXIN) tablet 500 mg  500 mg Oral Q8H PRN Malena Peer, NP   500 mg at 10/02/13 0806  . multivitamin with minerals tablet 1 tablet  1 tablet Oral Daily Nicholaus Bloom, MD   1 tablet at 10/02/13 [phone]  . naproxen (NAPROSYN) tablet 500 mg  500 mg Oral BID PRN Malena Peer, NP   500 mg at 10/01/13 2137  . nicotine polacrilex (NICORETTE) gum 2 mg  2 mg Oral PRN Nicholaus Bloom, MD      . ondansetron (ZOFRAN-ODT) disintegrating tablet 4 mg  4 mg Oral Q6H PRN Malena Peer, NP   4 mg at 10/01/13 0644  . ondansetron (ZOFRAN-ODT) disintegrating tablet 4 mg  4 mg Oral Q6H PRN Nicholaus Bloom, MD      . QUEtiapine (SEROQUEL) tablet 100 mg  100 mg Oral QHS Nicholaus Bloom, MD        Lab Results:  Results for orders placed during the hospital encounter of 09/30/13 (from the past 48 hour(s))  COMPREHENSIVE METABOLIC PANEL     Status: Abnormal   Collection Time    10/01/13  6:15 AM      Result Value Range   Sodium 141  137 - 147 mEq/L   Potassium 4.2  3.7 - 5.3 mEq/L    Chloride 101  96 - 112 mEq/L   CO2 28  19 - 32 mEq/L   Glucose, Bld 104 (*) 70 - 99 mg/dL   BUN 15  6 - 23 mg/dL   Creatinine, Ser 0.97  0.50 - 1.35 mg/dL   Calcium 9.9  8.4 -  10.5 mg/dL   Total Protein 8.2  6.0 - 8.3 g/dL   Albumin 4.4  3.5 - 5.2 g/dL   AST 23  0 - 37 U/L   ALT 38  0 - 53 U/L   Alkaline Phosphatase 121 (*) 39 - 117 U/L   Total Bilirubin 0.7  0.3 - 1.2 mg/dL   GFR calc non Af Amer >90  >90 mL/min   GFR calc Af Amer >90  >90 mL/min   Comment: (NOTE)     The eGFR has been calculated using the CKD EPI equation.     This calculation has not been validated in all clinical situations.     eGFR's persistently <90 mL/min signify possible Chronic Kidney     Disease.     Performed at [facility])     Status: None   Collection Time    10/01/13  8:09 PM      Result Value Range   SUDS Rapid HIV Screen NON REACTIVE  NON REACTIVE   Comment: RESULT CALLED TO, READ BACK BY AND VERIFIED WITH:     JOHNSON,S/BH '@2100'  ON 10/01/13 BY KARCZEWSKI,S.     Performed at [place], ACUTE     Status: Abnormal   Collection Time    10/01/13  8:09 PM      Result Value Range   Hepatitis B Surface Ag NEGATIVE  NEGATIVE   HCV Ab Reactive (*) NEGATIVE   Comment: (NOTE)                                                                               This test is for screening purposes only.  Reactive results should be     confirmed by an alternative method.  Suggest HCV Qualitative, PCR,     test code 83130.  Specimens will be stable for reflex testing up to 3     days after collection.   Hep A IgM NON REACTIVE  NON REACTIVE   Hep B C IgM NON REACTIVE  NON REACTIVE   Comment: (NOTE)     High levels of Hepatitis B Core IgM antibody are detectable     during the acute stage of Hepatitis B. This antibody is used     to differentiate current from past HBV infection.     Performed at [facility]     Physical Findings: AIMS: Facial and Oral Movements Muscles of Facial Expression: None, normal Lips and Perioral Area: None, normal Jaw: None, normal Tongue: None, normal,Extremity Movements Upper (arms, wrists, hands, fingers): None, normal Lower (legs, knees, ankles, toes): None, normal, Trunk Movements Neck, shoulders, hips: None, normal, Overall Severity Severity of abnormal movements (highest score from questions above): None, normal Incapacitation due to abnormal movements: None, normal Patient's awareness of abnormal movements (rate only patient's report): No Awareness, Dental Status Current problems with teeth and/or dentures?: No Does patient usually wear dentures?: No  CIWA:  CIWA-Ar Total: 0 COWS:  COWS Total Score: 12  Treatment Plan Summary: Daily contact with patient to assess and evaluate symptoms and progress in treatment Medication management  Plan: Supportive approach/coping skills/relapse prevention  Continue detox           Increase the Seroquel to 100 mg HS           Trial with Neurontin 100 mg TID           Consider starting an antidepressant           CBT;mindfulness, grief loss  Medical Decision Making Problem Points:  Review of psycho-social stressors (1) Data Points:  Review of medication regiment & side effects (2) Review of new medications or change in dosage (2)  I certify that inpatient services furnished can reasonably be expected to improve the patient's condition.   Pang Robers A 10/02/2013, 4:59 PM

## 2013-10-02 NOTE — Progress Notes (Signed)
NUTRITION ASSESSMENT  Pt identified as at risk on the Malnutrition Screen Tool  INTERVENTION: 1. Educated patient on the importance of nutrition and encouraged intake of food and beverages. 2. Supplements: Ensure Complete BID  NUTRITION DIAGNOSIS: Unintentional weight loss related to sub-optimal intake as evidenced by pt report.   Goal: Pt to meet >/= 90% of their estimated nutrition needs.  Monitor:  PO intake  Assessment:  Pt admitted with abusing Heroin and Xanax. Was in [facility] from August to December 2014. His brother was killed in a car accident. He left the program, relapsed after he went to the funeral. His family lives in [place]. He has been using heroin IV every day, using Xanax up to 4 mg daily. He had been using heroin on and off for 5 years. Before using heroin he was using pain pills Using Xanax for three years. Has used cocaine snorting. smokes marijuna occasionally and does not ingest alcohol. Admits that he has not grieved the death of his brother. Admits to an underlying depression.   Met with pt who reports fair appetite PTA, tries to eat 2 well balanced meals/day at home. Thinks he's lost 4-5 pounds in the past few months from doing drugs. Appetite improving today. Agreeable to trying Ensure Complete.   23 y.o. male  Height: Ht Readings from Last 1 Encounters:  09/30/13 5' 8.5" (1.74 m)    Weight: Wt Readings from Last 1 Encounters:  09/30/13 178 lb (80.74 kg)    Weight Hx: Wt Readings from Last 10 Encounters:  09/30/13 178 lb (80.74 kg)  05/13/13 181 lb (82.101 kg)    BMI:  Body mass index is 26.67 kg/(m^2). Pt meets criteria for overweight based on current BMI.  Estimated Nutritional Needs: Kcal: 25-30 kcal/kg Protein: > 1 gram protein/kg Fluid: 1 ml/kcal  Diet Order: General Pt is also offered choice of unit snacks mid-morning and mid-afternoon.  Pt is eating as desired.   Lab results and medications reviewed.   Mikey College  MS, [place] Pager [phone] After Hours Pager

## 2013-10-02 NOTE — Clinical Social Work Note (Signed)
Pt states that he doesn't want to go to [facility] at this time.  CSW spoke with pt's mother, with pt's consent to discuss his follow up.  Mother states that [facility].  CSW attempted to call and schedule an appointment for pt but no one answered.  No fax number at this time.  CSW will attempt on Monday and follow up with pt after d/c.   Russell IvanChelsea Horton, LCSW 10/02/2013  2:52 PM

## 2013-10-02 NOTE — Tx Team (Signed)
Interdisciplinary Treatment Plan Update (Adult)  Date: 10/02/2013  Time Reviewed:  9:45 AM  Progress in Treatment: Attending groups: Yes Participating in groups:  Yes Taking medication as prescribed:  Yes Tolerating medication:  Yes Family/Significant othe contact made: CSW assessing Patient understands diagnosis:  Yes Discussing patient identified problems/goals with staff:  Yes Medical problems stabilized or resolved:  Yes Denies suicidal/homicidal ideation: Yes Issues/concerns per patient self-inventory:  Yes Other:  New problem(s) identified: N/A  Discharge Plan or Barriers: CSW assessing for appropriate referrals.    Reason for Continuation of Hospitalization: Anxiety Depression Detox Medication Stabilization  Comments: N/A  Estimated length of stay: 3-4 days  For review of initial/current patient goals, please see plan of care.  Attendees: Patient:     Family:     Physician:  Dr. Dub MikesLugo 10/02/2013 10:13 AM   Nursing:   Burnetta SabinBrittany Tyson, RN 10/02/2013 10:13 AM   Clinical Social Worker:  Reyes Ivanhelsea Horton, LCSW 10/02/2013 10:13 AM   Other: Onnie BoerJennifer Clark, RN case manager 10/02/2013 10:13 AM   Other:  Trula SladeHeather Smart, LCSWA 10/02/2013 10:13 AM   Other:  Serena ColonelAggie Nwoko, NP 10/02/2013 10:13 AM   Other:     Other:    Other:    Other:    Other:    Other:    Other:     Scribe for Treatment Team:   Carmina MillerHorton, Lauralei Clouse Nicole, 10/02/2013 , 10:13 AM

## 2013-10-02 NOTE — [facility] Group Notes (Signed)
Adult Psychoeducational Group Note  Date:  10/02/2013 Time:  11:17 PM  Group Topic/Focus:  AA Meeting  Participation Level:  Minimal  Participation Quality:  Appropriate  Affect:  Appropriate  Cognitive:  Appropriate  Insight: Limited  Engagement in Group:  Limited  Modes of Intervention:  Discussion and Education  Additional Comments:  Riki RuskJeremy attended Morgan StanleyA meeting.  Caroll RancherLindsay, Shaylene Paganelli A 10/02/2013, 11:17 PM

## 2013-10-02 NOTE — [facility] Group Notes (Signed)
[facility]   10/02/2013  8:45 AM  Participation Quality:  Did Not Attend - pt was meeting with the MD  Russell Ivanhelsea Horton, LCSW 10/02/2013 2:24 PM

## 2013-10-02 NOTE — Progress Notes (Signed)
D: Patient denies SI/HI and A/V hallucinations; patient reports sleep is poor; reports appetite is improving ; reports energy level is low ; reports ability to pay attention is improving; rates depression as 4/10; rates hopelessness 8/10;   A: Monitored q 15 minutes; patient encouraged to attend groups; patient educated about medications; patient given medications per physician orders; patient encouraged to express feelings and/or concerns  R: Patient is cooperative but guarded; patient is appropriate to circumstances; patient's interaction with staff and peers is appropriate; patient was able to set goal to talk with staff 1:1 when having feelings of SI; patient is taking medications as prescribed and tolerating medications; patient is attending all groups

## 2013-10-02 NOTE — [facility] Group Notes (Signed)
[facility]   10/02/2013 8:45 AM  Participation Quality:  Alert, Appropriate and Oriented  Mood/Affect:  Calm  Depression Rating:  3  Anxiety Rating:  8  Thoughts of Suicide:  Pt denies SI/HI  Will you contract for safety?   Yes  Current AVH:  Pt denies  Plan for Discharge/Comments:  Pt attended discharge planning group and actively participated in group.  CSW provided pt with today's workbook.  Pt reports feeling much better today with no withdrawal symptoms today and improved energy level.  Pt states that he plans to return home for a few days before going back to [facility] for further inpatient treatment.  No further needs voiced by pt at this time.    Transportation Means: Pt reports access to transportation - mom will pick pt up  Supports: No supports mentioned at this time  Russell IvanChelsea Horton, LCSW 10/02/2013 9:30 AM

## 2013-10-02 NOTE — Progress Notes (Addendum)
Adult Psychoeducational Group Note  Date:  10/02/2013 Time:  1:02 PM  Group Topic/Focus:  Relapse Prevention Planning:   The focus of this group is to define relapse and discuss the need for planning to combat relapse.  Participation Level:  Minimal  Participation Quality:  Appropriate  Affect:  Appropriate  Cognitive:  Appropriate  Insight: Appropriate and Good  Engagement in Group:  Engaged  Modes of Intervention:  Discussion, Education, Socialization and Support  Additional Comments:  Pts discussed what relapse means and identified different triggers/early warning signs when they begin to spiral downward in order to change old/unhealthy habits/behaviours/attitudes. Pt identified hanging out with the wrong people as a part of his relapse. Pt stated he needed to go to meetings and get a counselor as one of the changes he needs to make.  Caswell CorwinOwen, Antwonette Feliz C 10/02/2013, 1:02 PM

## 2013-10-03 DIAGNOSIS — F1320 Sedative, hypnotic or anxiolytic dependence, uncomplicated: Secondary | ICD-10-CM

## 2013-10-03 DIAGNOSIS — F321 Major depressive disorder, single episode, moderate: Secondary | ICD-10-CM

## 2013-10-03 DIAGNOSIS — F1120 Opioid dependence, uncomplicated: Secondary | ICD-10-CM

## 2013-10-03 NOTE — [facility] Group Notes (Signed)
[facility] Group Notes:  (Clinical Social Work)  10/03/2013     10-11AM  Summary of Progress/Problems:   The main focus of today's process group was for the patient to identify ways in which they have in the past sabotaged their own recovery. Motivational Interviewing and a worksheet were utilized to help patients explore the perceived benefits and costs of their substance use, as well as the potential benefits and costs of stopping.  The Stages of Change were explained using a handout, and patients identified where they currently are with regard to stages of change.  The patient expressed that he self-sabotages with IV drug use, having been addicted since the age of 23.  He was [facility], then was pulled out of the program because of his brother's death in a car accident on 06/19/13.  He stated that he gets bored sometimes, and other times just starts thinking about his loss, and uses drugs to "block it out."  He feels he is in the Contemplation stage of change, is seeking to go to a program, perhaps even return to the [facility] program if possible.  Type of Therapy:  Group Therapy - Process   Participation Level:  Active  Participation Quality:  Attentive and Sharing  Affect:  Blunted and Depressed  Cognitive:  Appropriate and Oriented  Insight:  Engaged  Engagement in Therapy:  Engaged  Modes of Intervention:  Education, Teacher, English as a foreign languageupport and Processing, Motivational Interviewing  Pilgrim's PrideMareida Grossman-Orr, LCSW 10/03/2013, 12:12 PM

## 2013-10-03 NOTE — Progress Notes (Signed)
Adult Psychoeducational Group Note  Date:  10/03/2013 Time:  11:17 PM  Group Topic/Focus:  AA Meeting  Participation Level:  Active  Participation Quality:  Appropriate  Affect:  Appropriate  Cognitive:  Appropriate  Insight: Appropriate  Engagement in Group:  Engaged and Supportive  Modes of Intervention:  Support  Additional Comments:  Pt was engaged and participated in meeting. Shared how the loss of his brother is what made him relapse   Unknown Flannigan 10/03/2013, 11:17 PM

## 2013-10-03 NOTE — Progress Notes (Signed)
Patient ID: Russell Hall, male   DOB: 1991-04-02, 23 y.o.   MRN: [id number] [facility] MD Progress Note  10/03/2013 10:30 AM Russell Hall  MRN:  [id number] Subjective:  [name] complains of lack of energy this morning, low depression, no suicidal ideations.  He recently relapsed due to the death of his brother in a car accident, very close relationship.  He wants to get better to help parent his 76 yo nephew that was left behind.  [name] states he has a supportive family and plans to see a counselor after discharge for therapy. Diagnosis:   DSM5: Schizophrenia Disorders:  none Obsessive-Compulsive Disorders:  none Trauma-Stressor Disorders:  none Substance/Addictive Disorders:  Opioid Disorder - Severe (304.00), Benzodiazepine Disorder Depressive Disorders:  Major Depressive Disorder - Moderate (296.22) Total Time spent with patient: 30 minutes  Axis I: Bereavement  ADL's:  Intact  Sleep: Fair  Appetite:  Fair  Suicidal Ideation:  Plan:  denies Intent:  denies Means:  denies Homicidal Ideation:  Plan:  denies Intent:  denies Means:  denies AEB (as evidenced by):  Psychiatric Specialty Exam: Physical Exam  Review of Systems  Constitutional: Negative.   HENT: Negative.   Eyes: Negative.   Respiratory: Negative.   Cardiovascular: Negative.   Gastrointestinal: Negative.   Genitourinary: Negative.   Musculoskeletal: Negative.   Skin: Negative.   Neurological: Negative.   Endo/Heme/Allergies: Negative.   Psychiatric/Behavioral: Positive for depression and substance abuse. The patient is nervous/anxious.     Blood pressure 114/76, pulse 98, temperature 97.4 F (36.3 C), temperature source Oral, resp. rate 18, height 5' 8.5" (1.74 m), weight 80.74 kg (178 lb).Body mass index is 26.67 kg/(m^2).  General Appearance: Fairly Groomed  Patent attorney::  Fair  Speech:  Clear and Coherent and Slow  Volume:  Decreased  Mood:  Depressed  Affect:  sad, teary eyed  Thought Process:  Coherent and  Goal Directed  Orientation:  Full (Time, Place, and Person)  Thought Content:  symptoms, worries, concerns  Suicidal Thoughts:  No  Homicidal Thoughts:  No  Memory:  Immediate;   Fair Recent;   Fair Remote;   Fair  Judgement:  Fair  Insight:  Present  Psychomotor Activity:  Restlessness  Concentration:  Fair  Recall:  [employer] of Knowledge:Fair  Language: Fair  Akathisia:  No  Handed:    AIMS (if indicated):     Assets:  Desire for Improvement Housing Social Support Vocational/Educational  Sleep:  Number of Hours: 4.25   Musculoskeletal: Strength & Muscle Tone: within normal limits Gait & Station: normal Patient leans: N/A  Current Medications: Current Facility-Administered Medications  Medication Dose Route Frequency Provider Last Rate Last Dose  . acetaminophen (TYLENOL) tablet 650 mg  650 mg Oral Q6H PRN Evanna Janann August, NP      . alum & mag hydroxide-simeth (MAALOX/MYLANTA) 200-200-20 MG/5ML suspension 30 mL  30 mL Oral Q4H PRN Evanna Janann August, NP      . chlordiazePOXIDE (LIBRIUM) capsule 25 mg  25 mg Oral Q6H PRN Rachael Fee, MD      . chlordiazePOXIDE (LIBRIUM) capsule 25 mg  25 mg Oral BH-qamhs Rachael Fee, MD   25 mg at 10/03/13 7829   Followed by  . [START ON 10/04/2013] chlordiazePOXIDE (LIBRIUM) capsule 25 mg  25 mg Oral Daily Rachael Fee, MD      . cloNIDine (CATAPRES) tablet 0.1 mg  0.1 mg Oral QID Audrea Muscat, NP   0.1 mg at 10/03/13 [phone]  Followed by  . cloNIDine (CATAPRES) tablet 0.1 mg  0.1 mg Oral BH-qamhs Evanna Janann Augustori Burkett, NP       Followed by  . [START ON 10/06/2013] cloNIDine (CATAPRES) tablet 0.1 mg  0.1 mg Oral QAC breakfast Evanna Janann Augustori Burkett, NP      . dicyclomine (BENTYL) tablet 20 mg  20 mg Oral Q6H PRN Audrea MuscatEvanna Cori Burkett, NP   20 mg at 10/01/13 2002  . feeding supplement (ENSURE COMPLETE) (ENSURE COMPLETE) liquid 237 mL  237 mL Oral BID BM Lavena BullionHeather W Baron, RD   237 mL at 10/02/13 2134  . gabapentin (NEURONTIN) capsule  100 mg  100 mg Oral TID Rachael FeeIrving A Lugo, MD   100 mg at 10/03/13 [id number]  . hydrOXYzine (ATARAX/VISTARIL) tablet 25 mg  25 mg Oral Q6H PRN Audrea MuscatEvanna Cori Burkett, NP   25 mg at 10/03/13 0018  . hydrOXYzine (ATARAX/VISTARIL) tablet 25 mg  25 mg Oral Q6H PRN Rachael FeeIrving A Lugo, MD      . loperamide (IMODIUM) capsule 2-4 mg  2-4 mg Oral PRN Audrea MuscatEvanna Cori Burkett, NP   4 mg at 10/01/13 0644  . loperamide (IMODIUM) capsule 2-4 mg  2-4 mg Oral PRN Rachael FeeIrving A Lugo, MD      . magnesium hydroxide (MILK OF MAGNESIA) suspension 30 mL  30 mL Oral Daily PRN Evanna Janann Augustori Burkett, NP      . methocarbamol (ROBAXIN) tablet 500 mg  500 mg Oral Q8H PRN Audrea MuscatEvanna Cori Burkett, NP   500 mg at 10/03/13 0814  . multivitamin with minerals tablet 1 tablet  1 tablet Oral Daily Rachael FeeIrving A Lugo, MD   1 tablet at 10/03/13 [id number]  . naproxen (NAPROSYN) tablet 500 mg  500 mg Oral BID PRN Audrea MuscatEvanna Cori Burkett, NP   500 mg at 10/01/13 2137  . nicotine polacrilex (NICORETTE) gum 2 mg  2 mg Oral PRN Rachael FeeIrving A Lugo, MD      . ondansetron (ZOFRAN-ODT) disintegrating tablet 4 mg  4 mg Oral Q6H PRN Audrea MuscatEvanna Cori Burkett, NP   4 mg at 10/01/13 0644  . ondansetron (ZOFRAN-ODT) disintegrating tablet 4 mg  4 mg Oral Q6H PRN Rachael FeeIrving A Lugo, MD      . QUEtiapine (SEROQUEL) tablet 100 mg  100 mg Oral QHS Rachael FeeIrving A Lugo, MD   100 mg at 10/02/13 2130    Lab Results:  Results for orders placed during the hospital encounter of 09/30/13 (from the past 48 hour(s))  RAPID HIV SCREEN [facility])     Status: None   Collection Time    10/01/13  8:09 PM      Result Value Range   SUDS Rapid HIV Screen NON REACTIVE  NON REACTIVE   Comment: RESULT CALLED TO, READ BACK BY AND VERIFIED WITH:     JOHNSON,S/BH @2100  ON 10/01/13 BY KARCZEWSKI,S.     Performed at [facility]  HEPATITIS PANEL, ACUTE     Status: Abnormal   Collection Time    10/01/13  8:09 PM      Result Value Range   Hepatitis B Surface Ag NEGATIVE  NEGATIVE   HCV Ab Reactive (*) NEGATIVE   Comment:  (NOTE)  This test is for screening purposes only.  Reactive results should be     confirmed by an alternative method.  Suggest HCV Qualitative, PCR,     test code 16109.  Specimens will be stable for reflex testing up to 3     days after collection.   Hep A IgM NON REACTIVE  NON REACTIVE   Hep B C IgM NON REACTIVE  NON REACTIVE   Comment: (NOTE)     High levels of Hepatitis B Core IgM antibody are detectable     during the acute stage of Hepatitis B. This antibody is used     to differentiate current from past HBV infection.     Performed at [facility]    Physical Findings: AIMS: Facial and Oral Movements Muscles of Facial Expression: None, normal Lips and Perioral Area: None, normal Jaw: None, normal Tongue: None, normal,Extremity Movements Upper (arms, wrists, hands, fingers): None, normal Lower (legs, knees, ankles, toes): None, normal, Trunk Movements Neck, shoulders, hips: None, normal, Overall Severity Severity of abnormal movements (highest score from questions above): None, normal Incapacitation due to abnormal movements: None, normal Patient's awareness of abnormal movements (rate only patient's report): No Awareness, Dental Status Current problems with teeth and/or dentures?: No Does patient usually wear dentures?: No  CIWA:  CIWA-Ar Total: 4 COWS:  COWS Total Score: 12  Treatment Plan Summary: Daily contact with patient to assess and evaluate symptoms and progress in treatment Medication management  Plan:  Review of chart, vital signs, medications, and notes. 1-Individual and group therapy 2-Medication management for depression and anxiety:  Medications reviewed with the patient and he denied side effects, no changes made Librium alcohol detox protocol Clonidine opiate detox protocol Seroquel 100 mg at bedtime for sleep issues 3-Coping skills for depression, anxiety, and  substance dependency 4-Continue crisis stabilization and management 5-Address health issues--monitoring vital signs, stable 6-Treatment plan in progress to prevent relapse of depression, substance use, and anxiety  Medical Decision Making Problem Points:  Review of psycho-social stressors (1) Data Points:  Review of medication regiment & side effects (2) Review of new medications or change in dosage (2)  I certify that inpatient services furnished can reasonably be expected to improve the patient's condition.   Nanine Means, PMH-NP 10/03/2013, 10:30 AM  I agreed with the findings, treatment and disposition plan of this patient. Kathryne Sharper, MD

## 2013-10-03 NOTE — Progress Notes (Signed)
D: Pt denies SI/HI/AVH tonight. Pt reports mild symptoms of sweats, nausea and restlessness. Pt reports having difficulty sleeping. Pt given vistaril to help with sleep at pts request. A: Medications administered as ordered per MD. Verbal support given. Pt encouraged to attend groups. 15 minute checks performed for safety. R: Pt safety maintained. Per pt, he is feeling a lot better.

## 2013-10-03 NOTE — Progress Notes (Signed)
D  Pt is sad and depressed  Pt reports symptoms of withdrawal include craving agitation and sedation     He said he slept faair last night and his appetite is improving  He reports a low energy level and his concentration and ability to pay attention is improving    He rates his depression at a 5 and hopelessness at a 3  He reports a strong desire to complete his detox  Succeed at his longterm treatment and maintain sobriety   A   Verbal support given  Medications administered and effectiveness monitored   Q 15 min checks R   Pt safe at present

## 2013-10-03 NOTE — Progress Notes (Signed)
Psychoeducational Group Note  Date:  10/03/2013 Time:  0115 pm  Group Topic/Focus:  Identifying Needs:   The focus of this group is to help patients identify their personal needs that have been historically problematic and identify healthy behaviors to address their needs.  Participation Level:  Active  Participation Quality:  Appropriate  Affect:  Appropriate  Cognitive:  Alert and Appropriate  Insight:  Developing/Improving  Engagement in Group:  Developing/Improving  Additional Comments:    Andrena Mewsuttall, Corbett Moulder J 10/03/2013,4:21 PM

## 2013-10-04 DIAGNOSIS — F411 Generalized anxiety disorder: Secondary | ICD-10-CM

## 2013-10-04 DIAGNOSIS — F1910 Other psychoactive substance abuse, uncomplicated: Secondary | ICD-10-CM

## 2013-10-04 DIAGNOSIS — F1994 Other psychoactive substance use, unspecified with psychoactive substance-induced mood disorder: Secondary | ICD-10-CM

## 2013-10-04 DIAGNOSIS — Z634 Disappearance and death of family member: Secondary | ICD-10-CM

## 2013-10-04 MED ORDER — QUETIAPINE FUMARATE 100 MG PO TABS: 100.0000 mg | ORAL_TABLET | Freq: Every day | ORAL | Status: AC

## 2013-10-04 MED ORDER — HYDROXYZINE HCL 25 MG PO TABS: 25.0000 mg | ORAL_TABLET | Freq: Four times a day (QID) | ORAL | Status: AC | PRN

## 2013-10-04 MED ORDER — GABAPENTIN 100 MG PO CAPS: 100.0000 mg | ORAL_CAPSULE | Freq: Three times a day (TID) | ORAL | Status: AC

## 2013-10-04 NOTE — [facility] Suicide Risk Assessment (Signed)
   Demographic Factors:  Male, Adolescent or young adult and Caucasian  Total Time spent with patient: 30 minutes  Psychiatric Specialty Exam: Physical Exam  ROS  Blood pressure 144/78, pulse 87, temperature 97.9 F (36.6 C), temperature source Oral, resp. rate 18, height 5' 8.5" (1.74 m), weight 178 lb (80.74 kg).Body mass index is 26.67 kg/(m^2).  General Appearance: Casual  Eye Contact::  Fair  Speech:  Normal Rate  Volume:  Normal  Mood:  Anxious  Affect:  Congruent  Thought Process:  Coherent, Goal Directed, Linear and Logical  Orientation:  Full (Time, Place, and Person)  Thought Content:  WDL  Suicidal Thoughts:  No  Homicidal Thoughts:  No  Memory:  Immediate;   Good Recent;   Good Remote;   Good  Judgement:  Good  Insight:  Good  Psychomotor Activity:  Normal  Concentration:  Good  Recall:  Good  Fund of Knowledge:Good  Language: Good  Akathisia:  No  Handed:  Right  AIMS (if indicated):     Assets:  Communication Skills Desire for Improvement Financial Resources/Insurance Housing Physical Health Social Support  Sleep:  Number of Hours: 4.25    Musculoskeletal: Strength & Muscle Tone: within normal limits Gait & Station: normal Patient leans: N/A   Mental Status Per Nursing Assessment::   On Admission:  NA  Current Mental Status by Physician: see notes  Loss Factors: Loss of significant relationship and using drugs  Historical Factors: history of using drugs  Risk Reduction Factors:   Sense of responsibility to family, Religious beliefs about death, Living with another person, especially a relative, Positive social support, Positive therapeutic relationship and Positive coping skills or problem solving skills  Continued Clinical Symptoms:  Alcohol/Substance Abuse/Dependencies  Cognitive Features That Contribute To Risk:  Polarized thinking    Suicide Risk:  Minimal: No identifiable suicidal ideation.  Patients presenting with no risk  factors but with morbid ruminations; may be classified as minimal risk based on the severity of the depressive symptoms  Discharge Diagnoses:   AXIS I:  Anxiety Disorder NOS, Substance Abuse, Substance Induced Mood Disorder and bereavent AXIS II:  Deferred AXIS III:   Past Medical History  Diagnosis Date  . Substance abuse   . Mental disorder   . Depression    AXIS IV:  other psychosocial or environmental problems, problems related to social environment and problems with primary support group AXIS V:  61-70 mild symptoms  Plan Of Care/Follow-up recommendations:  Activity:  as tolerated Diet:  unchanged from past  Is patient on multiple antipsychotic therapies at discharge:  No   Has Patient had three or more failed trials of antipsychotic monotherapy by history:  No  Recommended Plan for Multiple Antipsychotic Therapies: NA    Manal Kreutzer T. 10/04/2013, 10:42 AM

## 2013-10-04 NOTE — Progress Notes (Signed)
Patient ID: Russell BalintJeremy Marietta, male   DOB: [birth year], 23 y.o.   MRN: [id number]  D: Patient pleasant on approach. Reports that he is feeling better at present. Reports he just wants to go to bed. Reported improved mood. A: Staff will monitor on q 15 minute checks, follow treatment plan, and give meds as ordered. R: Cooperative on unit.

## 2013-10-04 NOTE — Discharge Summary (Signed)
Physician Discharge Summary Note  Patient:  Russell BalintJeremy Mitch is an 23 y.o., male MRN:  [id number] DOB:  [birth year] Patient phone:  [phone] (home)  Patient address:   [address] [place] [id number],  Total Time spent with patient: 30 minutes  Date of Admission:  09/30/2013 Date of Discharge: 10/04/2013  Reason for Admission:  Benzodiazepine and heroin detox/dependency  Discharge Diagnoses: Active Problems:   Benzodiazepine dependence   Opioid dependence   Major depression   Bereavement   Psychiatric Specialty Exam: Physical Exam  Constitutional: He is oriented to person, place, and time. He appears well-developed and well-nourished.  HENT:  Head: Normocephalic and atraumatic.  Neck: Normal range of motion.  Respiratory: Effort normal.  GI: Soft.  Musculoskeletal: Normal range of motion.  Neurological: He is alert and oriented to person, place, and time.  Skin: Skin is warm and dry.    Review of Systems  Constitutional: Negative.   HENT: Negative.   Eyes: Negative.   Respiratory: Negative.   Cardiovascular: Negative.   Gastrointestinal: Negative.   Genitourinary: Negative.   Musculoskeletal: Negative.   Skin: Negative.   Neurological: Negative.   Endo/Heme/Allergies: Negative.   Psychiatric/Behavioral: Positive for substance abuse. The patient is nervous/anxious.     Blood pressure 146/93, pulse 106, temperature 97.9 F (36.6 C), temperature source Oral, resp. rate 18, height 5' 8.5" (1.74 m), weight 178 lb (80.74 kg).Body mass index is 26.67 kg/(m^2).  General Appearance: Casual  Eye Contact::  Good  Speech:  Normal Rate  Volume:  Normal  Mood:  Anxious  Affect:  Congruent  Thought Process:  Coherent  Orientation:  Full (Time, Place, and Person)  Thought Content:  WDL  Suicidal Thoughts:  No  Homicidal Thoughts:  No  Memory:  Immediate;   Fair Recent;   Fair Remote;   Fair  Judgement:  Fair  Insight:  Fair  Psychomotor Activity:  Normal  Concentration:   Fair  Recall:  [employer]  Fund of Knowledge:Fair  Language: Fair  Akathisia:  No  Handed:  Right  AIMS (if indicated):     Assets:  Physical Health Resilience Social Support  Sleep:  Number of Hours: 4.25    Past Psychiatric History: Diagnosis:  Heroin and benzodiazepine dependency  [facility]  Outpatient Care:  None  [facility]  Self-Mutilation:  None  Suicidal Attempts:  None  Violent Behaviors:  None   Musculoskeletal: Strength & Muscle Tone: within normal limits Gait & Station: normal Patient leans: N/A  DSM5:  Substance/Addictive Disorders:  Opioid Disorder - Severe (304.00) Depressive Disorders:  Major Depressive Disorder (296.99)  Axis Diagnosis:   AXIS I:  Anxiety Disorder NOS, Bereavement, Substance Abuse and Substance Induced Mood Disorder AXIS II:  Deferred AXIS III:   Past Medical History  Diagnosis Date  . Substance abuse   . Mental disorder   . Depression    AXIS IV:  other psychosocial or environmental problems, problems related to social environment and problems with primary support group AXIS V:  61-70 mild symptoms  Level of Care:  OP  Hospital Course:  On admission:  23 Y/o male who states he has been abusing Heroin and Xanax. Was in [employer] from August to December 2014. His brother was killed in a car accident. He left the program, relapsed after he went to the funeral. [place] in [place]. He has been using heroin IV every day, using Xanax up to 4 mg daily.  He had been using heroin on and off for 5 years. Before using heroin he was using pain pills Using Xanax for three years. Has used cocaine snorting. smokes marijuna occasionally and does not ingest alcohol. Admits that he has not grieved the death of his brother. Admits to an underlying depression.  During hospitalization:  Medications Managed--Librium benzodiazepine and Clonidine opiate detox protocol utilized.  His Xanax home medication was  not continued.  Gabapentin 100 mg TID for neuropathic pain and Seroquel 100 mg at bedtime for sleep issues started.  Ronnie attended and participated in therapy.  He denied suicidal/homicidal ideations and auditory/visual hallucinations, follow-up appointments encouraged to attend, outside support groups encouraged and information given, Rx given.  Fabricio is mentally and physically stable for discharge.  Consults:  None  Significant Diagnostic Studies:  labs: completed, reviewed, stable  Discharge Vitals:   Blood pressure 146/93, pulse 106, temperature 97.9 F (36.6 C), temperature source Oral, resp. rate 18, height 5' 8.5" (1.74 m), weight 178 lb (80.74 kg). Body mass index is 26.67 kg/(m^2). Lab Results:   Results for orders placed during the hospital encounter of 09/30/13 (from the past 72 hour(s))  RAPID HIV SCREEN [facility])     Status: None   Collection Time    10/01/13  8:09 PM      Result Value Range   SUDS Rapid HIV Screen NON REACTIVE  NON REACTIVE   Comment: RESULT CALLED TO, READ BACK BY AND VERIFIED WITH:     JOHNSON,S/BH @2100  ON 10/01/13 BY KARCZEWSKI,S.     Performed at [facility]  HEPATITIS PANEL, ACUTE     Status: Abnormal   Collection Time    10/01/13  8:09 PM      Result Value Range   Hepatitis B Surface Ag NEGATIVE  NEGATIVE   HCV Ab Reactive (*) NEGATIVE   Comment: (NOTE)                                                                               This test is for screening purposes only.  Reactive results should be     confirmed by an alternative method.  Suggest HCV Qualitative, PCR,     test code 54098.  Specimens will be stable for reflex testing up to 3     days after collection.   Hep A IgM NON REACTIVE  NON REACTIVE   Hep B C IgM NON REACTIVE  NON REACTIVE   Comment: (NOTE)     High levels of Hepatitis B Core IgM antibody are detectable     during the acute stage of Hepatitis B. This antibody is used     to differentiate current  from past HBV infection.     Performed at [facility]    Physical Findings: AIMS: Facial and Oral Movements Muscles of Facial Expression: None, normal Lips and Perioral Area: None, normal Jaw: None, normal Tongue: None, normal,Extremity Movements Upper (arms, wrists, hands, fingers): None, normal Lower (legs, knees, ankles, toes): None, normal, Trunk Movements Neck, shoulders, hips: None, normal, Overall Severity Severity of abnormal movements (highest score from questions above): None, normal Incapacitation due to abnormal movements: None, normal Patient's awareness of abnormal movements (  rate only patient's report): No Awareness, Dental Status Current problems with teeth and/or dentures?: No Does patient usually wear dentures?: No  CIWA:  CIWA-Ar Total: 5 COWS:  COWS Total Score: 6  Psychiatric Specialty Exam: See Psychiatric Specialty Exam and Suicide Risk Assessment completed by Attending Physician prior to discharge.  Discharge destination:  Home  Is patient on multiple antipsychotic therapies at discharge:  No   Has Patient had three or more failed trials of antipsychotic monotherapy by history:  No  Recommended Plan for Multiple Antipsychotic Therapies: NA  Discharge Orders   Future Orders Complete By Expires   Activity as tolerated - No restrictions  As directed    Diet - low sodium heart healthy  As directed        Medication List    STOP taking these medications       ALPRAZolam 1 MG tablet  Commonly known as:  XANAX      TAKE these medications     Indication   gabapentin 100 MG capsule  Commonly known as:  NEURONTIN  Take 1 capsule (100 mg total) by mouth 3 (three) times daily.   Indication:  Neuropathic Pain     hydrOXYzine 25 MG tablet  Commonly known as:  ATARAX/VISTARIL  Take 1 tablet (25 mg total) by mouth every 6 (six) hours as needed for anxiety.      QUEtiapine 100 MG tablet  Commonly known as:  SEROQUEL  Take 1 tablet (100 mg  total) by mouth at bedtime.   Indication:  Trouble Sleeping           Follow-up Information   Follow up with [facility]. (CSW attempted to schedule an appointment on 2/6 but no one answered. Call on Monday to schedule for medication maangement and therapy. )    Contact information:   [address] [place], [place] [address] Phone: [phone] Fax:       Follow-up recommendations:  Activity:  as tolerated Diet:  low-sodium heart healthy diet  Comments:   Take all your medications as prescribed by your mental healthcare provider. Report any adverse effects and or reactions from your medicines to your outpatient provider promptly. Patient is instructed and cautioned to not engage in alcohol and or illegal drug use while on prescription medicines. In the event of worsening symptoms, patient is instructed to call the crisis hotline, 911 and or go to the nearest ED for appropriate evaluation and treatment of symptoms. Follow-up with your primary care provider for your other medical issues, concerns and or health care needs.  Total Discharge Time:  Greater than 30 minutes.  SignedNanine Means, PMH-NP 10/04/2013, 8:33 AM  I have personally seen the patient and agreed with the findings and involved in the treatment plan. Kathryne Sharper, MD

## 2013-10-04 NOTE — [facility] Group Notes (Signed)
[facility] Group Notes:  (Clinical Social Work)  10/04/2013  10:00-11:00AM  Summary of Progress/Problems:   The main focus of today's process group was to   identify the patient's current support system and decide on other supports that can be put in place.  The picture on workbook was used to discuss why additional supports are needed.  An emphasis was placed on using counselor, doctor, therapy groups, 12-step groups, and problem-specific support groups to expand supports.   There was also an extensive discussion about what constitutes a healthy support versus an unhealthy support.  The patient expressed full comprehension of the concepts presented, and agreed that there is a need to add more supports.  The patient stated the current supports in place are his parents, nursing sister.  The unhealthy supports are his old high school friends and he was able to identify what he is going to say to them.  The supports that are to be added are a sponsor (he thinks), and a therapist appointment.  Type of Therapy:  Process Group with Motivational Interviewing  Participation Level:  Active  Participation Quality:  Attentive  Affect:  Blunted  Cognitive:  Appropriate  Insight:  Engaged  Engagement in Therapy:  Engaged  Modes of Intervention:   Education, Support and Processing, Activity  Pilgrim's PrideMareida Grossman-Orr, LCSW 10/04/2013, 12:15pm

## 2013-10-04 NOTE — Progress Notes (Signed)
Patient ID: Russell BalintJeremy Sesay, male   DOB: [birth year], 23 y.o.   MRN: [id number] Pt discharged to mother at this time. Pt denies SI/HI. Discharge instructions provided and pt verbalized understanding. Pt provided with prescriptions and supply of medications. All belongings returned.

## 2013-10-04 NOTE — [facility] Group Notes (Signed)
[facility] Group Notes:  (Nursing/MHT/Case Management/Adjunct)  Date:  10/04/2013  Time:  9:33 AM  Type of Therapy:  Psychoeducational Skills  Participation Level:  Active  Participation Quality:  Appropriate and Attentive  Affect:  Appropriate  Cognitive:  Alert, Appropriate and Oriented  Insight:  Appropriate and Good  Engagement in Group:  Engaged  Modes of Intervention:  Discussion  Summary of Progress/Problems: Pt states he plans on seeing a counselor on a outpatient basis and wants to go a long term treatment facility. Pt states he has great support from his family.  Renaee MundaSadler, Prachi Oftedahl Thomas 10/04/2013, 9:33 AM

## 2013-10-04 NOTE — Progress Notes (Signed)
[facility] Group Notes:  (Nursing/MHT/Case Management/Adjunct)  Date:  10/04/2013  Time:  9:08 AM  Type of Therapy:  Psychoeducational Skills  Participation Level:  Active  Participation Quality:  Appropriate   Affect:  Appropriate  Cognitive:  Appropriate   Insight:  Appropriate and Good  Engagement in Group:  Engaged.  Modes of Intervention:  Activity and Socialization  Summary of Progress/Problems: Pts played an activity called "Unqiue Qualities". Pts wrote down 5 unique qualities about themselves and then had to guess who those qualities belonged to. Pts were able to share things about themselves that they don't normally share with others. Pts learned about their peers and that they are all going through similar problems and not alone. Pt was engaged and participated.  Russell Hall C 10/04/2013, 9:08 AM 

## 2013-10-04 NOTE — Progress Notes (Signed)
D:  Patient's self inventory sheet, patient has fair sleep, good appetite, normal energy level, good attention span.  Rated depression 1, hopeless 2, anxiety 8.  Has experienced cravings.  Denied SI.  Worst pain in past 24 hours #1, pain goal #1.  Plans to change his friends when he returns home.  No problems taking meds after discharge. A:  Medications administered per MD orders.  Emotional support and encouragement given patient. R:  Denied SI and HI.  Denied A/V hallucinations.  Denied pain.  Will continue to monitor patient for safety with 15 minute checks.  Safety maintained. Patient is looking forward to discharge today.

## 2013-10-04 NOTE — Discharge Instructions (Signed)
Depression, Adult °Depression is feeling sad, low, down in the dumps, blue, gloomy, or empty. In general, there are two kinds of depression: °· Normal sadness or grief. This can happen after something upsetting. It often goes away on its own within 2 weeks. After losing a loved one (bereavement), normal sadness and grief may last longer than two weeks. It usually gets better with time. °· Clinical depression. This kind lasts longer than normal sadness or grief. It keeps you from doing the things you normally do in life. It is often hard to function at home, work, or at school. It may affect your relationships with others. Treatment is often needed. °GET HELP RIGHT AWAY IF: °· You have thoughts about hurting yourself or others. °· You lose touch with reality (psychotic symptoms). You may: °· See or hear things that are not real. °· Have untrue beliefs about your life or people around you. °· Your medicine is giving you problems. °MAKE SURE YOU: °· Understand these instructions. °· Will watch your condition. °· Will get help right away if you are not doing well or get worse. °Document Released: 09/15/2010 Document Revised: 05/07/2012 Document Reviewed: 12/13/2011 °[employer]® Patient Information ©2014 [employer]. ° °

## 2013-10-08 NOTE — Progress Notes (Signed)
Patient Discharge Instructions:  After Visit Summary (AVS):   Faxed to:  10/08/13 Discharge Summary Note:   Faxed to:  10/08/13 Psychiatric Admission Assessment Note:   Faxed to:  10/08/13 Suicide Risk Assessment - Discharge Assessment:   Faxed to:  10/08/13 Faxed/Sent to the Next Level Care provider:  10/08/13 [facility] @ [phone]  Jerelene ReddenSheena E Garza-Salinas II, 10/08/2013, 2:06 PM

## 2014-08-27 DEATH — deceased

## 2015-05-25 IMAGING — CR DG ANKLE COMPLETE 3+V*L*
3 series · 3 of 3 positions shown · non-contrast
Comparison: None.

CLINICAL DATA: Pain and swelling laterally after fall and twisting.

EXAM:
LEFT ANKLE COMPLETE - 3+ VIEW

[x ankle ap left]
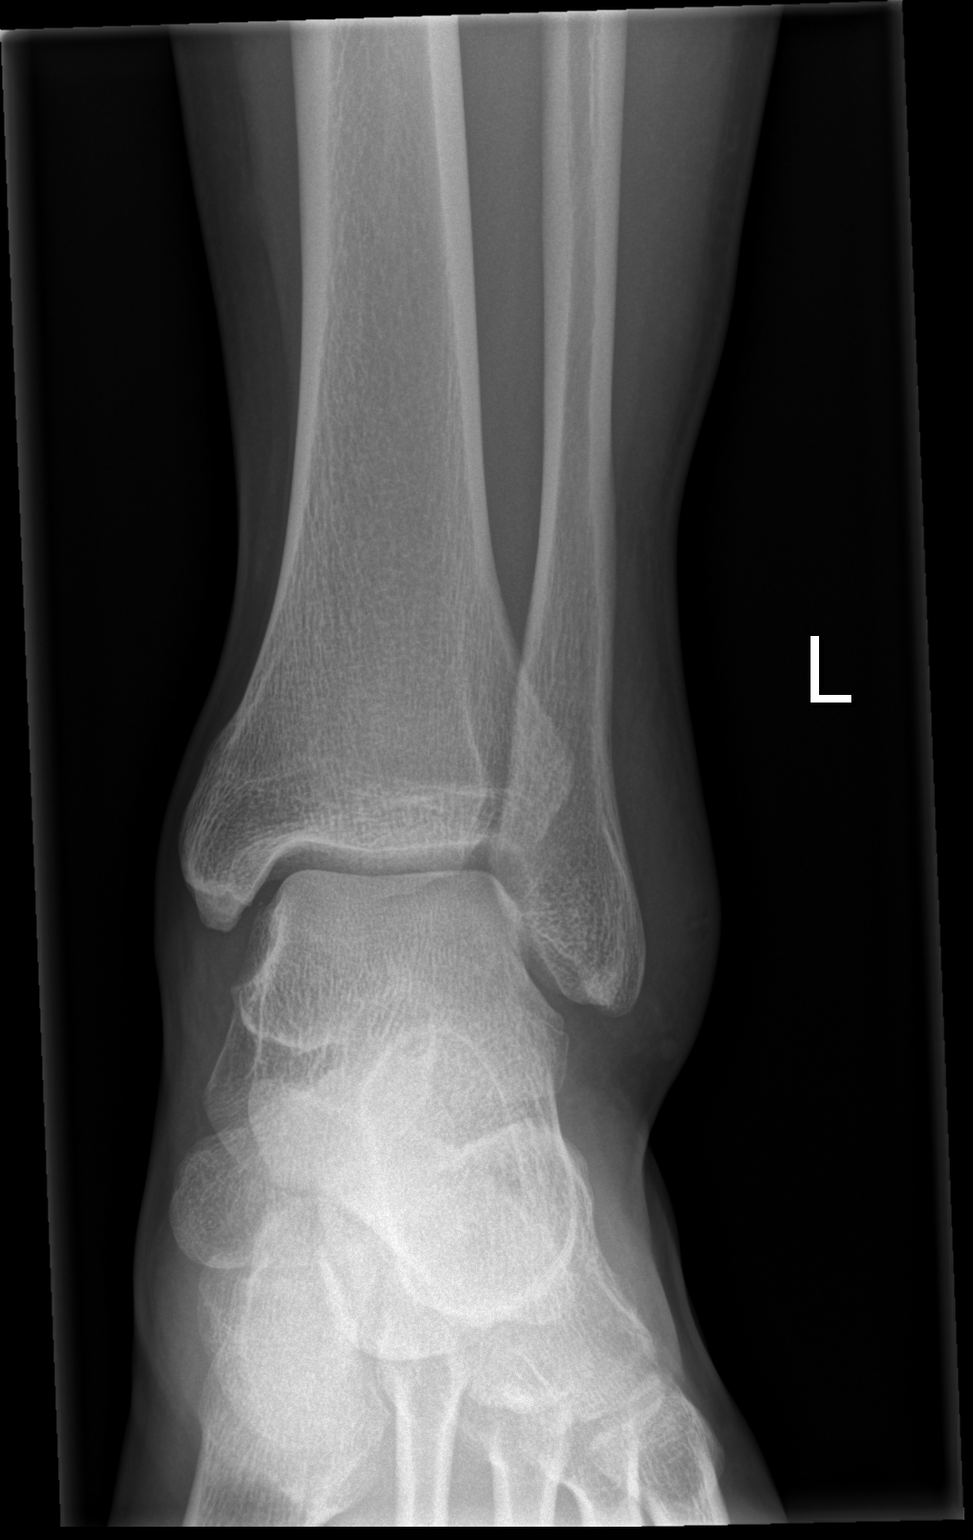

[x ankle obl left]
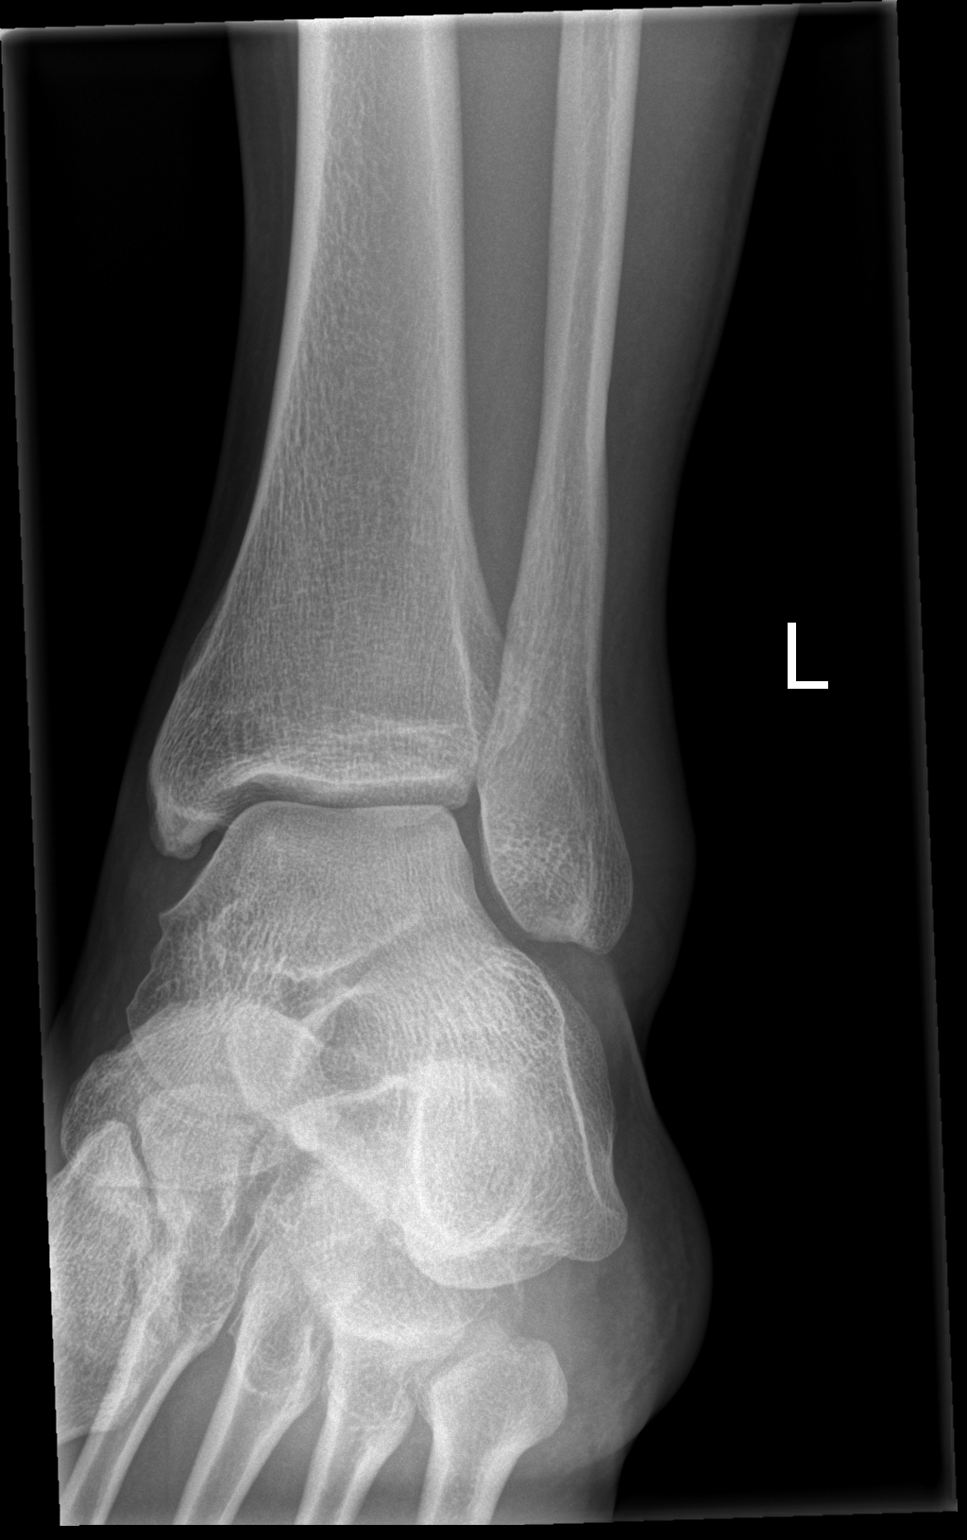

[x ankle lat left]
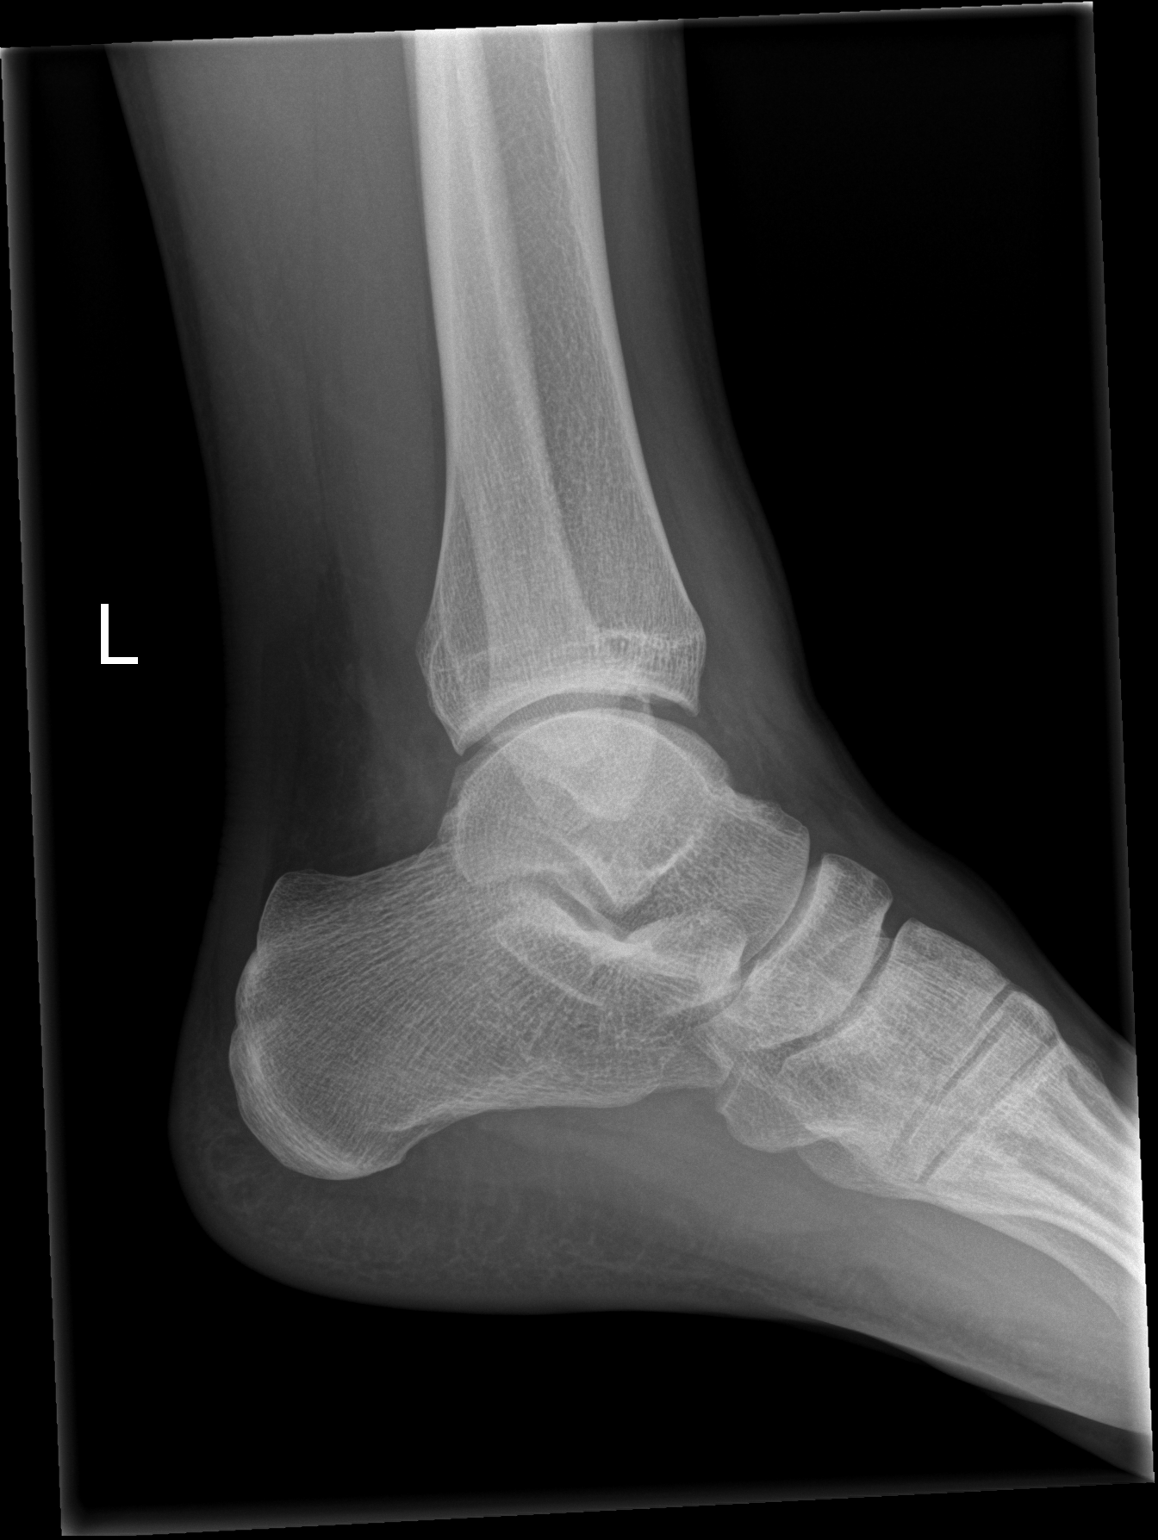

[3 of 3 positions shown; findings below may reference images not displayed]

FINDINGS: Negative for fracture. Ankle mortise intact. No osseous degenerative
change. Normal alignment. Lateral soft tissue swelling.
IMPRESSION: Lateral soft tissue swelling without fracture suggesting ligamentous
injury.
